# Patient Record
Sex: Female | Born: 1965 | Race: White | Hispanic: No | Marital: Single | Smoking: Never smoker
Health system: Southern US, Community
[De-identification: ages and names within clinical notes are randomized; demographics above are authoritative.]

## PROBLEM LIST (undated history)

## (undated) DIAGNOSIS — K589 Irritable bowel syndrome without diarrhea: Secondary | ICD-10-CM

## (undated) HISTORY — PX: BREAST ENHANCEMENT SURGERY: SHX7

## (undated) HISTORY — PX: ABDOMINAL HYSTERECTOMY: SHX81

## (undated) HISTORY — PX: TONSILLECTOMY: SUR1361

## (undated) HISTORY — DX: Irritable bowel syndrome, unspecified: K58.9

---

## 2003-09-15 ENCOUNTER — Emergency Department (HOSPITAL_COMMUNITY): Admission: EM | Admit: 2003-09-15 | Discharge: 2003-09-15 | Payer: Self-pay | Admitting: Emergency Medicine

## 2009-05-03 ENCOUNTER — Encounter
Admission: RE | Admit: 2009-05-03 | Discharge: 2009-08-01 | Payer: Self-pay | Admitting: Physical Medicine and Rehabilitation

## 2009-08-07 ENCOUNTER — Encounter
Admission: RE | Admit: 2009-08-07 | Discharge: 2009-10-14 | Payer: Self-pay | Admitting: Physical Medicine and Rehabilitation

## 2009-11-29 ENCOUNTER — Encounter: Admission: RE | Admit: 2009-11-29 | Discharge: 2009-11-29 | Payer: Self-pay | Admitting: Gastroenterology

## 2011-04-26 ENCOUNTER — Emergency Department (HOSPITAL_BASED_OUTPATIENT_CLINIC_OR_DEPARTMENT_OTHER)
Admission: EM | Admit: 2011-04-26 | Discharge: 2011-04-26 | Disposition: A | Discharge status: Home / Self Care | Payer: BC Managed Care – PPO | Attending: Emergency Medicine | Admitting: Emergency Medicine

## 2011-04-26 DIAGNOSIS — K3184 Gastroparesis: Secondary | ICD-10-CM | POA: Insufficient documentation

## 2011-04-26 DIAGNOSIS — R109 Unspecified abdominal pain: Secondary | ICD-10-CM | POA: Insufficient documentation

## 2014-07-05 ENCOUNTER — Ambulatory Visit (INDEPENDENT_AMBULATORY_CARE_PROVIDER_SITE_OTHER): Payer: BC Managed Care – PPO

## 2014-07-05 ENCOUNTER — Encounter: Payer: Self-pay | Admitting: Podiatry

## 2014-07-05 ENCOUNTER — Ambulatory Visit (INDEPENDENT_AMBULATORY_CARE_PROVIDER_SITE_OTHER): Payer: BC Managed Care – PPO | Admitting: Podiatry

## 2014-07-05 VITALS — BP 104/65 | HR 72 | Resp 16

## 2014-07-05 DIAGNOSIS — Z9889 Other specified postprocedural states: Secondary | ICD-10-CM

## 2014-07-05 DIAGNOSIS — M7750 Other enthesopathy of unspecified foot: Secondary | ICD-10-CM

## 2014-07-05 NOTE — Progress Notes (Signed)
Subjective:     Patient ID: Gabriela Young, female   DOB: November 18, 1965, 48 y.o.   MRN: [id number]  HPI patient presents that I was concerned because of some pain in the outside of my left foot for the last week or 2. Patient had surgery last year   Review of Systems     Objective:   Physical Exam Neurovascular status intact with well-healed surgical site fifth metatarsal left foot with good alignment noted and mild edema in the plantar surface    Assessment:     Probable low-grade inflammatory capsulitis left fifth MPJ    Plan:     Reviewed condition and at this time do not recommend aggressive treatment and less he gets worse. She will begin taking Aleve and will be seen back to recheck as needed

## 2017-01-01 ENCOUNTER — Ambulatory Visit (INDEPENDENT_AMBULATORY_CARE_PROVIDER_SITE_OTHER): Payer: BC Managed Care – PPO

## 2017-01-01 ENCOUNTER — Encounter: Payer: Self-pay | Admitting: Podiatry

## 2017-01-01 ENCOUNTER — Ambulatory Visit (INDEPENDENT_AMBULATORY_CARE_PROVIDER_SITE_OTHER): Payer: BC Managed Care – PPO | Admitting: Podiatry

## 2017-01-01 VITALS — BP 96/63 | HR 74 | Resp 16

## 2017-01-01 DIAGNOSIS — M2010 Hallux valgus (acquired), unspecified foot: Secondary | ICD-10-CM

## 2017-01-01 DIAGNOSIS — M21621 Bunionette of right foot: Secondary | ICD-10-CM | POA: Diagnosis not present

## 2017-01-01 NOTE — Patient Instructions (Signed)
Pre-Operative Instructions  Congratulations, you have decided to take an important step to improving your quality of life.  You can be assured that the doctors of [facility] will be with you every step of the way.  1. Plan to be at the surgery center/hospital at least 1 (one) hour prior to your scheduled time unless otherwise directed by the surgical center/hospital staff.  You must have a responsible adult accompany you, remain during the surgery and drive you home.  Make sure you have directions to the surgical center/hospital and know how to get there on time. 2. For hospital based surgery you will need to obtain a history and physical form from your family physician within 1 month prior to the date of surgery- we will give you a form for you primary physician.  3. We make every effort to accommodate the date you request for surgery.  There are however, times where surgery dates or times have to be moved.  We will contact you as soon as possible if a change in schedule is required.   4. No Aspirin/Ibuprofen for one week before surgery.  If you are on aspirin, any non-steroidal anti-inflammatory medications (Mobic, Aleve, Ibuprofen) you should stop taking it 7 days prior to your surgery.  You make take Tylenol  For pain prior to surgery.  5. Medications- If you are taking daily heart and blood pressure medications, seizure, reflux, allergy, asthma, anxiety, pain or diabetes medications, make sure the surgery center/hospital is aware before the day of surgery so they may notify you which medications to take or avoid the day of surgery. 6. No food or drink after midnight the night before surgery unless directed otherwise by surgical center/hospital staff. 7. No alcoholic beverages 24 hours prior to surgery.  No smoking 24 hours prior to or 24 hours after surgery. 8. Wear loose pants or shorts- loose enough to fit over bandages, boots, and casts. 9. No slip on shoes, sneakers are best. 10. Bring  your boot with you to the surgery center/hospital.  Also bring crutches or a walker if your physician has prescribed it for you.  If you do not have this equipment, it will be provided for you after surgery. 11. If you have not been contracted by the surgery center/hospital by the day before your surgery, call to confirm the date and time of your surgery. 12. Leave-time from work may vary depending on the type of surgery you have.  Appropriate arrangements should be made prior to surgery with your employer. 13. Prescriptions will be provided immediately following surgery by your doctor.  Have these filled as soon as possible after surgery and take the medication as directed. 14. Remove nail polish on the operative foot. 15. Wash the night before surgery.  The night before surgery wash the foot and leg well with the antibacterial soap provided and water paying special attention to beneath the toenails and in between the toes.  Rinse thoroughly with water and dry well with a towel.  Perform this wash unless told not to do so by your physician.  Enclosed: 1 Ice pack (please put in freezer the night before surgery)   1 Hibiclens skin cleaner   Pre-op Instructions  If you have any questions regarding the instructions, do not hesitate to call our office.  [place]: [address] [place], [place] [address] [phone]  [place]: [address] [place], [place] [address] [phone]  [place]: [address]  [place], [place] [address] [phone]   Dr.   Norman Regal DPM, Dr. Matthew Wagoner DPM, Dr. M. Todd Hyatt DPM, Dr. Titorya Stover DPM 

## 2017-01-03 NOTE — Progress Notes (Signed)
Subjective:     Patient ID: Gabriela Young, female   DOB: May 24, 1966, 51 y.o.   MRN: [id number]  HPI patient presents stating that she's had a lot of pain in her right bunion and the outside and she's known she's needed to get it fixed. She had her left one fixed about 4 years ago and did well   Review of Systems  All other systems reviewed and are negative.      Objective:   Physical Exam  Constitutional: She is oriented to person, place, and time.  Cardiovascular: Intact distal pulses.   Musculoskeletal: Normal range of motion.  Neurological: She is oriented to person, place, and time.  Skin: Skin is warm.  Nursing note and vitals reviewed.  neurovascular status intact muscle strength adequate range of motion within normal limits with patient found to have hyperostosis medial aspect first metatarsal head right with redness around the surface and pain in the outside of the fifth metatarsal right which is painful. 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 She wants to get this fixed and has made the decision to do this in the near future and at this time since she is oriented or other foot done I did allow her to read consent form reviewing alternative treatments complications. Patient wants surgery and after extensive review signs consent form for distal osteotomy right and distal fifth metatarsal right. I explained everything as listed and the fact recovery can take approximately 6 months and at this time I did dispense air fracture walker for postoperative usage with instructions  X-ray indicates that there is good healing of the osteotomy with good structural position of the first and fifth metatarsal left foot with  patient noted to have elevation of the intermetatarsal angle right and fifth metatarsal right with most of the deformity being on the medial dorsal side of the first metatarsal

## 2017-01-15 ENCOUNTER — Telehealth: Payer: Self-pay | Admitting: *Deleted

## 2017-01-15 NOTE — Telephone Encounter (Signed)
"  I'm calling to schedule my surgery."  I have you scheduled for April 17.  "I had not decided on that date because I needed to check with my boss but I'm glad you did go ahead and schedule me.  What time will I need to be there?"  Someone from the surgical center will call you the Friday or Monday prior to surgery date with the arrival time.

## 2017-02-10 ENCOUNTER — Telehealth: Payer: Self-pay | Admitting: *Deleted

## 2017-02-10 NOTE — Telephone Encounter (Signed)
"  I am scheduled for surgery on Tuesday of next week.  I had told Dr. Paulla Dolly that I had a shoe at home.  I can't find it.  So how do I go about getting a shoe?  I have a boot."  You will get a boot in our office when you come back for your follow-up and when Dr. Paulla Dolly wants you to transition into a shoe.  "Okay, thank you so much."

## 2017-02-16 ENCOUNTER — Encounter: Payer: Self-pay | Admitting: Podiatry

## 2017-02-16 DIAGNOSIS — M21541 Acquired clubfoot, right foot: Secondary | ICD-10-CM | POA: Diagnosis not present

## 2017-02-16 DIAGNOSIS — M2011 Hallux valgus (acquired), right foot: Secondary | ICD-10-CM | POA: Diagnosis not present

## 2017-02-24 ENCOUNTER — Ambulatory Visit (INDEPENDENT_AMBULATORY_CARE_PROVIDER_SITE_OTHER): Payer: BC Managed Care – PPO

## 2017-02-24 ENCOUNTER — Encounter: Payer: Self-pay | Admitting: Podiatry

## 2017-02-24 ENCOUNTER — Ambulatory Visit (INDEPENDENT_AMBULATORY_CARE_PROVIDER_SITE_OTHER): Payer: BC Managed Care – PPO | Admitting: Podiatry

## 2017-02-24 VITALS — BP 105/61 | HR 63 | Resp 16

## 2017-02-24 DIAGNOSIS — Z9889 Other specified postprocedural states: Secondary | ICD-10-CM

## 2017-02-24 DIAGNOSIS — M21621 Bunionette of right foot: Secondary | ICD-10-CM | POA: Diagnosis not present

## 2017-02-24 DIAGNOSIS — M2010 Hallux valgus (acquired), unspecified foot: Secondary | ICD-10-CM | POA: Diagnosis not present

## 2017-02-24 NOTE — Progress Notes (Signed)
Subjective:    Patient ID: Gabriela Young, female   DOB: 51 y.o.   MRN: [id number]   HPI patient states she's doing really well with her foot    ROS      Objective:  Physical Exam    Neurovascular status intact negative Homans sign noted with wound edges well coapted right both first and fifth metatarsal with good range of motion and no pain Assessment:     Doing very well post osteotomy first fifth metatarsal right with wound edges well coapted good range of motion and good alignment    Plan:    Reviewed x-rays reapplied dressing instructed on continued elevation compression immobilization and reappoint in 3 weeks or earlier if needed  X-rays indicate osteotomies are healing well with pin screw in place and no indications of pathology

## 2017-03-11 NOTE — Progress Notes (Signed)
DOS [id number] Autin Bunionectomy with pin fixation (cutting and move bone) Metatarsal osteotomy with screw fixation 5th rt

## 2017-03-17 ENCOUNTER — Ambulatory Visit (INDEPENDENT_AMBULATORY_CARE_PROVIDER_SITE_OTHER): Payer: BC Managed Care – PPO | Admitting: Podiatry

## 2017-03-17 ENCOUNTER — Ambulatory Visit (INDEPENDENT_AMBULATORY_CARE_PROVIDER_SITE_OTHER): Payer: BC Managed Care – PPO

## 2017-03-17 ENCOUNTER — Encounter: Payer: Self-pay | Admitting: Podiatry

## 2017-03-17 VITALS — BP 101/59 | HR 78 | Resp 16

## 2017-03-17 DIAGNOSIS — M21621 Bunionette of right foot: Secondary | ICD-10-CM

## 2017-03-17 DIAGNOSIS — M2010 Hallux valgus (acquired), unspecified foot: Secondary | ICD-10-CM

## 2017-03-17 NOTE — Progress Notes (Signed)
Subjective:    Patient ID: Gabriela Young, female   DOB: 51 y.o.   MRN: [id number]   HPI patient states that she's doing well with mild discomfort if she's on her foot to long but overall minimal pain and is working full time    ROS      Objective:  Physical Exam Neurovascular status intact with patient's right foot healing well with wound edges well coapted hallux in rectus position fifth MPJ and good alignment with    Assessment:    Doing well overall from foot surgery approximately 4 weeks     Plan:    X-ray taken and reviewed and recommended continued compression moderate immobilization and not going barefoot and no intense type activities  X-rays indicate that healing is occurring but there is some stress around the fifth metatarsal but it should heal uneventfully and will be watched. Reappoint in 6 weeks or earlier if necessary

## 2017-04-28 ENCOUNTER — Ambulatory Visit: Payer: BC Managed Care – PPO | Admitting: Podiatry

## 2017-04-29 ENCOUNTER — Encounter: Payer: BC Managed Care – PPO | Admitting: Podiatry

## 2017-05-07 ENCOUNTER — Ambulatory Visit (INDEPENDENT_AMBULATORY_CARE_PROVIDER_SITE_OTHER): Payer: BC Managed Care – PPO | Admitting: Podiatry

## 2017-05-07 ENCOUNTER — Ambulatory Visit (INDEPENDENT_AMBULATORY_CARE_PROVIDER_SITE_OTHER): Payer: BC Managed Care – PPO

## 2017-05-07 DIAGNOSIS — G5782 Other specified mononeuropathies of left lower limb: Secondary | ICD-10-CM

## 2017-05-07 DIAGNOSIS — G5762 Lesion of plantar nerve, left lower limb: Secondary | ICD-10-CM

## 2017-05-07 DIAGNOSIS — M21621 Bunionette of right foot: Secondary | ICD-10-CM

## 2017-05-07 NOTE — Progress Notes (Signed)
Subjective:    Patient ID: Gabriela Young, female   DOB: 52 y.o.   MRN: [id number]   HPI patient states she's doing well but is developed some shooting pain in her left foot with radiating discomfort into the digits    ROS      Objective:  Physical Exam neurovascular status intact negative Homans sign noted with right foot healing very well post osteotomy and fifth metatarsal osteotomy. Left third interspace show shooting radiating pains into the adjacent digits     Assessment:    Doing very well post osteotomy surgery right with probable neuroma symptomatology left     Plan:    H&P x-ray reviewed right and I did do a neuro lysis injection to reduce the in pulses of the nerve of the left third interspace and added a small amount steroid to reduce inflammation. Patient will be seen back to reevaluate again in 4 weeks  X-ray report indicated the osteotomies are healing very well with pin screw in place and good alignment noted. Also discussed long-term orthotics with patient

## 2017-06-09 ENCOUNTER — Ambulatory Visit (INDEPENDENT_AMBULATORY_CARE_PROVIDER_SITE_OTHER): Payer: BC Managed Care – PPO

## 2017-06-09 ENCOUNTER — Ambulatory Visit (INDEPENDENT_AMBULATORY_CARE_PROVIDER_SITE_OTHER): Payer: BC Managed Care – PPO | Admitting: Podiatry

## 2017-06-09 ENCOUNTER — Encounter: Payer: Self-pay | Admitting: Podiatry

## 2017-06-09 DIAGNOSIS — G5782 Other specified mononeuropathies of left lower limb: Secondary | ICD-10-CM

## 2017-06-09 DIAGNOSIS — M21621 Bunionette of right foot: Secondary | ICD-10-CM | POA: Diagnosis not present

## 2017-06-09 DIAGNOSIS — G5762 Lesion of plantar nerve, left lower limb: Secondary | ICD-10-CM

## 2017-06-09 NOTE — Progress Notes (Signed)
Subjective:    Patient ID: Gabriela Young, female   DOB: 51 y.o.   MRN: [id number]   HPI patient states that her right foot seems to be progressing well but her left foot continues to exhibit discomfort in the third interspace with improvement from previous treatment that was done approximately 1 month ago    ROS      Objective:  Physical Exam right foot shows well-healing surgical sites first and fifth metatarsal with wound edges well coapted good range of motion and mild edema in the left third interspace continues to show positive neuroma symptomatology with moderate shooting pain of the third interspace     Assessment:   Doing well from structural correction right with neuroma symptomatology left      Plan:     H&P condition reviewed and we'll focus on the left foot today. I went ahead did a sterile prep of the left foot I injected directly into the nerve with a combination of purified alcohol Marcaine a small amount steroid to reduce inflammation around the nerve root and patient be seen back 6 weeks to reevaluate

## 2017-07-19 ENCOUNTER — Ambulatory Visit: Payer: BC Managed Care – PPO | Admitting: Podiatry

## 2017-07-28 ENCOUNTER — Encounter: Payer: Self-pay | Admitting: Podiatry

## 2017-07-28 ENCOUNTER — Ambulatory Visit (INDEPENDENT_AMBULATORY_CARE_PROVIDER_SITE_OTHER): Payer: BC Managed Care – PPO | Admitting: Podiatry

## 2017-07-28 DIAGNOSIS — G5762 Lesion of plantar nerve, left lower limb: Secondary | ICD-10-CM

## 2017-07-28 DIAGNOSIS — G5782 Other specified mononeuropathies of left lower limb: Secondary | ICD-10-CM

## 2017-07-28 NOTE — Progress Notes (Signed)
Subjective:    Patient ID: Gabriela Young, female   DOB: 51 y.o.   MRN: [id number]   HPI patient states she seems to be improving but still feels and not and at times discomfort    ROS      Objective:  Physical Exam neurovascular status intact with patient's third interspace left continuing to exhibit radiating discomfort but it is localized and not as bad as it was in future     Assessment:    Improving neuroma symptomatology left     Plan:   Reinjected the nerve with a agent to both reduce inflammation and reduce the nerve irritation with a combination of purified alcohol Marcaine small amount of Kenalog. Patient tolerated procedure well and will be seen back 4 weeks

## 2017-08-25 ENCOUNTER — Encounter: Payer: Self-pay | Admitting: Podiatry

## 2017-08-25 ENCOUNTER — Ambulatory Visit (INDEPENDENT_AMBULATORY_CARE_PROVIDER_SITE_OTHER): Payer: BC Managed Care – PPO | Admitting: Podiatry

## 2017-08-25 DIAGNOSIS — G5762 Lesion of plantar nerve, left lower limb: Secondary | ICD-10-CM | POA: Diagnosis not present

## 2017-08-25 DIAGNOSIS — G5782 Other specified mononeuropathies of left lower limb: Secondary | ICD-10-CM

## 2017-08-25 NOTE — Progress Notes (Signed)
Subjective:    Patient ID: Gabriela Young, female   DOB: 51 y.o.   MRN: [id number]   HPI patient presents stating I'm starting to get shooting pain again and I did have good relief for a period of time with one shoe creating irritation    ROS      Objective:  Physical Exam neurovascular status intact with discomfort in the third interspace left with shooting radiating pains that had improved but is bothering her again     Assessment:    Neuroma symptomatology     Plan:    Reinjected the nerve to reduce all inflammation and to provide for neuro lysis of fact with a combination of purified alcohol small amount of steroidal medication and Marcaine with epinephrine which was tolerated well

## 2017-09-25 DIAGNOSIS — N952 Postmenopausal atrophic vaginitis: Secondary | ICD-10-CM | POA: Insufficient documentation

## 2017-12-22 ENCOUNTER — Encounter: Payer: Self-pay | Admitting: Podiatry

## 2017-12-22 ENCOUNTER — Ambulatory Visit: Payer: BC Managed Care – PPO

## 2017-12-22 ENCOUNTER — Ambulatory Visit: Payer: BC Managed Care – PPO | Admitting: Podiatry

## 2017-12-22 ENCOUNTER — Ambulatory Visit (INDEPENDENT_AMBULATORY_CARE_PROVIDER_SITE_OTHER): Payer: BC Managed Care – PPO

## 2017-12-22 ENCOUNTER — Other Ambulatory Visit: Payer: Self-pay | Admitting: Podiatry

## 2017-12-22 DIAGNOSIS — M79672 Pain in left foot: Secondary | ICD-10-CM | POA: Diagnosis not present

## 2017-12-22 DIAGNOSIS — G5762 Lesion of plantar nerve, left lower limb: Secondary | ICD-10-CM | POA: Diagnosis not present

## 2017-12-22 DIAGNOSIS — G5761 Lesion of plantar nerve, right lower limb: Secondary | ICD-10-CM | POA: Diagnosis not present

## 2017-12-22 DIAGNOSIS — M79671 Pain in right foot: Secondary | ICD-10-CM | POA: Diagnosis not present

## 2017-12-22 DIAGNOSIS — G5781 Other specified mononeuropathies of right lower limb: Secondary | ICD-10-CM

## 2017-12-22 DIAGNOSIS — G5782 Other specified mononeuropathies of left lower limb: Secondary | ICD-10-CM

## 2017-12-22 NOTE — Patient Instructions (Signed)
Pre-Operative Instructions  Congratulations, you have decided to take an important step towards improving your quality of life.  You can be assured that the doctors and staff at [facility] will be with you every step of the way.  Here are some important things you should know:  1. Plan to be at the surgery center/hospital at least 1 (one) hour prior to your scheduled time, unless otherwise directed by the surgical center/hospital staff.  You must have a responsible adult accompany you, remain during the surgery and drive you home.  Make sure you have directions to the surgical center/hospital to ensure you arrive on time. 2. If you are having surgery at [facility] or [facility] hospitals, you will need a copy of your medical history and physical form from your family physician within one month prior to the date of surgery. We will give you a form for your primary physician to complete.  3. We make every effort to accommodate the date you request for surgery.  However, there are times where surgery dates or times have to be moved.  We will contact you as soon as possible if a change in schedule is required.   4. No aspirin/ibuprofen for one week before surgery.  If you are on aspirin, any non-steroidal anti-inflammatory medications (Mobic, Aleve, Ibuprofen) should not be taken seven (7) days prior to your surgery.  You make take Tylenol for pain prior to surgery.  5. Medications - If you are taking daily heart and blood pressure medications, seizure, reflux, allergy, asthma, anxiety, pain or diabetes medications, make sure you notify the surgery center/hospital before the day of surgery so they can tell you which medications you should take or avoid the day of surgery. 6. No food or drink after midnight the night before surgery unless directed otherwise by surgical center/hospital staff. 7. No alcoholic beverages 24-hours prior to surgery.  No smoking 24-hours prior or 24-hours after  surgery. 8. Wear loose pants or shorts. They should be loose enough to fit over bandages, boots, and casts. 9. Don't wear slip-on shoes. Sneakers are preferred. 10. Bring your boot with you to the surgery center/hospital.  Also bring crutches or a walker if your physician has prescribed it for you.  If you do not have this equipment, it will be provided for you after surgery. 11. If you have not been contacted by the surgery center/hospital by the day before your surgery, call to confirm the date and time of your surgery. 12. Leave-time from work may vary depending on the type of surgery you have.  Appropriate arrangements should be made prior to surgery with your employer. 13. Prescriptions will be provided immediately following surgery by your doctor.  Fill these as soon as possible after surgery and take the medication as directed. Pain medications will not be refilled on weekends and must be approved by the doctor. 14. Remove nail polish on the operative foot and avoid getting pedicures prior to surgery. 15. Wash the night before surgery.  The night before surgery wash the foot and leg well with water and the antibacterial soap provided. Be sure to pay special attention to beneath the toenails and in between the toes.  Wash for at least three (3) minutes. Rinse thoroughly with water and dry well with a towel.  Perform this wash unless told not to do so by your physician.  Enclosed: 1 Ice pack (please put in freezer the night before surgery)   1 Hibiclens skin cleaner     Pre-op instructions  If you have any questions regarding the instructions, please do not hesitate to call our office.  [place]: [address], [place], [place] [address] -- [phone]  [place]: [address] [place], [place] [address] -- [phone]  [place]: [address]  [place], [place] [address] -- [phone]  [place]: [address], [place], [place] [address] -- [phone]  Website:  [email or web address] 

## 2017-12-22 NOTE — Progress Notes (Signed)
Subjective:   Patient ID: Gabriela Young, female   DOB: 52 y.o.   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  I allowed her to read consent form going over the procedure and risk and patient wants surgery signed consent form after extensive review.  Patient understands total recovery to take 6 months to 1 year and today I debrided the lesions on the right with no iatrogenic bleeding noted

## 2017-12-24 ENCOUNTER — Telehealth: Payer: Self-pay | Admitting: *Deleted

## 2017-12-24 NOTE — Telephone Encounter (Signed)
"  I saw Dr. Paulla Dolly this past week.  He is going to repair a neuroma for me.  I was wondering if Tuesday March 25 is available.  He even offered to do it on a Wednesday.  I'm going to try and do the Tuesday if that's possible.  Give me a call at your convenience."

## 2017-12-27 NOTE — Telephone Encounter (Signed)
I called and left her a message that I was going to schedule her for March 26.  I told her that if that date isn't good for her, to give me a call back and we can reschedule it.

## 2017-12-27 NOTE — Telephone Encounter (Signed)
"  I got your message.  I just want to make sure I heard you correctly.  You said you were scheduling me for March 26 correct?"  Yes, that is correct.  "What time will I need to be there.  I forget the protocol."  Someone from the surgical center will call you with the arrival time the Friday or Monday prior to the surgery date with the arrival time.

## 2018-01-25 ENCOUNTER — Encounter: Payer: Self-pay | Admitting: Podiatry

## 2018-01-25 DIAGNOSIS — G5762 Lesion of plantar nerve, left lower limb: Secondary | ICD-10-CM | POA: Diagnosis not present

## 2018-02-02 ENCOUNTER — Ambulatory Visit (INDEPENDENT_AMBULATORY_CARE_PROVIDER_SITE_OTHER): Payer: BC Managed Care – PPO | Admitting: Podiatry

## 2018-02-02 ENCOUNTER — Encounter: Payer: Self-pay | Admitting: Podiatry

## 2018-02-02 DIAGNOSIS — N951 Menopausal and female climacteric states: Secondary | ICD-10-CM | POA: Insufficient documentation

## 2018-02-02 DIAGNOSIS — D3610 Benign neoplasm of peripheral nerves and autonomic nervous system, unspecified: Secondary | ICD-10-CM

## 2018-02-02 DIAGNOSIS — M2010 Hallux valgus (acquired), unspecified foot: Secondary | ICD-10-CM

## 2018-02-02 DIAGNOSIS — K589 Irritable bowel syndrome without diarrhea: Secondary | ICD-10-CM | POA: Insufficient documentation

## 2018-02-02 DIAGNOSIS — M21621 Bunionette of right foot: Secondary | ICD-10-CM

## 2018-02-02 NOTE — Progress Notes (Signed)
Subjective:   Patient ID: Gabriela Young, female   DOB: 52 y.o.   MRN: [id number]   HPI Patient states doing very well with the left foot with minimal discomfort   ROS      Objective:  Physical Exam  Neurovascular status intact with patient's left third interspace healing well wound edges well coapted     Assessment:  Doing well from neurectomy third interspace left     Plan:  Instructed on soaks and gradual return to soft shoe gear with continued elevation as needed.  Patient be seen back on an as-needed basis and has routine healing at this time

## 2018-02-04 ENCOUNTER — Encounter: Payer: Self-pay | Admitting: Podiatry

## 2019-05-11 ENCOUNTER — Other Ambulatory Visit: Payer: Self-pay

## 2019-05-11 ENCOUNTER — Encounter: Payer: Self-pay | Admitting: Podiatry

## 2019-05-11 ENCOUNTER — Ambulatory Visit: Payer: BC Managed Care – PPO | Admitting: Podiatry

## 2019-05-11 ENCOUNTER — Other Ambulatory Visit: Payer: Self-pay | Admitting: Podiatry

## 2019-05-11 ENCOUNTER — Ambulatory Visit (INDEPENDENT_AMBULATORY_CARE_PROVIDER_SITE_OTHER): Payer: BC Managed Care – PPO

## 2019-05-11 VITALS — Temp 98.2°F

## 2019-05-11 DIAGNOSIS — M7752 Other enthesopathy of left foot: Secondary | ICD-10-CM | POA: Diagnosis not present

## 2019-05-12 NOTE — Progress Notes (Signed)
Subjective:   Patient ID: Gabriela Young, female   DOB: 53 y.o.   MRN: [id number]   HPI Patient presents stating my fourth toe has really started to hurt on my left foot and I am not sure what I did.  States is been hurting for the last 2 to 3 months and at times makes shoe gear difficult   ROS      Objective:  Physical Exam  Neurovascular status intact with well-healed surgical sites bilateral with patient noted to have a mild edematous of the fourth digit left at the distal interphalangeal joint with discomfort with palpation.  Patient is found to have good digital perfusion well oriented     Assessment:  Probability for inflammatory capsulitis or arthritis of the inner phalangeal joint digit 4 left     Plan:  H NP x-ray reviewed and today I did a proximal block 60 mg like Marcaine mixture after proper anesthesia I did do a small injection of the joint with 2 mg Dexasone Kenalog 3 mg Xylocaine and advised on wider shoes.  If symptoms persist may require distal arthroplasty  X-rays indicated there may be slight compression of the joint with good healing of the osteotomy first and fifth metatarsal left with fixation in place signed visit

## 2021-02-10 ENCOUNTER — Emergency Department (HOSPITAL_BASED_OUTPATIENT_CLINIC_OR_DEPARTMENT_OTHER): Payer: BC Managed Care – PPO

## 2021-02-10 ENCOUNTER — Emergency Department (HOSPITAL_BASED_OUTPATIENT_CLINIC_OR_DEPARTMENT_OTHER)
Admission: EM | Admit: 2021-02-10 | Discharge: 2021-02-10 | Disposition: A | Discharge status: Home / Self Care | Payer: BC Managed Care – PPO | Attending: Emergency Medicine | Admitting: Emergency Medicine

## 2021-02-10 ENCOUNTER — Encounter (HOSPITAL_BASED_OUTPATIENT_CLINIC_OR_DEPARTMENT_OTHER): Payer: Self-pay

## 2021-02-10 ENCOUNTER — Other Ambulatory Visit: Payer: Self-pay

## 2021-02-10 DIAGNOSIS — T148XXA Other injury of unspecified body region, initial encounter: Secondary | ICD-10-CM

## 2021-02-10 DIAGNOSIS — S2239XA Fracture of one rib, unspecified side, initial encounter for closed fracture: Secondary | ICD-10-CM | POA: Insufficient documentation

## 2021-02-10 DIAGNOSIS — S32019A Unspecified fracture of first lumbar vertebra, initial encounter for closed fracture: Secondary | ICD-10-CM | POA: Diagnosis not present

## 2021-02-10 DIAGNOSIS — S0990XA Unspecified injury of head, initial encounter: Secondary | ICD-10-CM | POA: Diagnosis not present

## 2021-02-10 DIAGNOSIS — S3992XA Unspecified injury of lower back, initial encounter: Secondary | ICD-10-CM | POA: Diagnosis present

## 2021-02-10 DIAGNOSIS — W108XXA Fall (on) (from) other stairs and steps, initial encounter: Secondary | ICD-10-CM | POA: Insufficient documentation

## 2021-02-10 DIAGNOSIS — M25521 Pain in right elbow: Secondary | ICD-10-CM | POA: Diagnosis not present

## 2021-02-10 DIAGNOSIS — W19XXXA Unspecified fall, initial encounter: Secondary | ICD-10-CM

## 2021-02-10 MED ORDER — ACETAMINOPHEN 500 MG PO TABS
1000.0000 mg | ORAL_TABLET | Freq: Once | ORAL | Status: AC
  Administered 2021-02-10: 1000 mg via ORAL
  Filled 2021-02-10: qty 2

## 2021-02-10 MED ORDER — KETOROLAC TROMETHAMINE 60 MG/2ML IM SOLN
30.0000 mg | Freq: Once | INTRAMUSCULAR | Status: AC
  Administered 2021-02-10: 30 mg via INTRAMUSCULAR
  Filled 2021-02-10: qty 2

## 2021-02-10 MED ORDER — CYCLOBENZAPRINE HCL 10 MG PO TABS: 10.0000 mg | ORAL_TABLET | Freq: Two times a day (BID) | ORAL | 0 refills | Status: AC | PRN

## 2021-02-10 NOTE — Discharge Instructions (Addendum)
Seen after a fall, imaging reveals that you have a acute nondisplaced avulsion fracture of the right L1 transverse process.  I have given you a prescription for a muscle relaxer please take as prescribed.  Please beware this medication make you drowsy do not consume alcohol or operate heavy machinery while taking this medication.  Recommend taking over-the-counter pain medications like ibuprofen and/or Tylenol every 6 hours as needed.  Please follow dosage and on the back of bottle.    I like you to follow-up with your PCP in 2 to 4 weeks for recheck of your lumbar spine.  Come back to the emergency department if you develop chest pain, shortness of breath, severe abdominal pain, uncontrolled nausea, vomiting, diarrhea.

## 2021-02-10 NOTE — ED Provider Notes (Signed)
[facility]ER[facility]ENC[facility] DEPARTMENT Provider Note   CSN: [id number] Arrival date & time: 02/10/21  1057     History Chief Complaint  Patient presents with  . Fall    Gabriela Young is a 55 y.o. female.  HPI    Patient with no significant medical history presents to the emergency department after having a mechanical fall.  She endorses today she was at the top of her stairs and slipped on the wood causing her to fall onto her right side.  She endorses that she slid down approximately 8 steps, she states she hit her head, but denies losing conscious, is not on anticoagulant.  She denies headaches, change in vision, worsening paresthesias or weakness in the upper or lower extremities, denies lightheadedness, dizziness, felt slightly nauseated.  She endorsed that she has severe right elbow pain, right rib pain, and lower back pain.  She states movement tends to make the pain worse, she denies  alleviating factors.  She has not had any medications for this.  Patient denies headaches, fevers, chills, shortness of breath, chest pain, abdominal pain, vomiting, diarrhea, worsening pedal edema.  Past Medical History:  Diagnosis Date  . Irritable bowel syndrome (IBS)     Patient Active Problem List   Diagnosis Date Noted  . IBS (irritable bowel syndrome) 02/02/2018  . Menopausal syndrome 02/02/2018  . Atrophic vaginitis 09/25/2017    Past Surgical History:  Procedure Laterality Date  . ABDOMINAL HYSTERECTOMY    . BREAST ENHANCEMENT SURGERY    . TONSILLECTOMY       OB History   No obstetric history on file.     History reviewed. No pertinent family history.  Social History   Tobacco Use  . Smoking status: Never Smoker  . Smokeless tobacco: Never Used  Substance Use Topics  . Alcohol use: Yes  . Drug use: Never    Home Medications Prior to Admission medications   Medication Sig Start Date End Date Taking? Authorizing Provider  cyclobenzaprine (FLEXERIL) 10 MG  tablet Take 1 tablet (10 mg total) by mouth 2 (two) times daily as needed for muscle spasms. 02/10/21  Yes Marcello Fennel, PA-C  CycloSPORINE (RESTASIS OP) Apply to eye.    [provider]  estradiol (ESTRACE) 0.1 MG/GM vaginal cream estradiol 0.01% (0.1 mg/gram) vaginal cream  PLACE 1 GRAM VAGINALLY 2 TIMES A [facility]K HS    [provider]  estradiol (VIVELLE-DOT) 0.1 MG/24HR patch APP 1 PA EXT TO THE SKIN 2 TIMES A [facility]K 11/15/17   [provider]  Rolan Lipa 290 MCG CAPS capsule  05/10/19   [provider]    Allergies    Sulfa antibiotics  Review of Systems   Review of Systems  Constitutional: Negative for chills and fever.  HENT: Negative for congestion and sore throat.   Eyes: Negative for visual disturbance.  Respiratory: Negative for shortness of breath.   Cardiovascular: Negative for chest pain.  Gastrointestinal: Positive for nausea. Negative for abdominal pain, constipation and vomiting.  Genitourinary: Negative for enuresis and flank pain.  Musculoskeletal: Positive for back pain.       Right rib pain, right elbow pain  Skin: Negative for rash.  Neurological: Negative for dizziness and headaches.  Hematological: Does not bruise/bleed easily.    Physical Exam Updated Vital Signs BP 110/67 (BP Location: Right Arm)   Pulse 63   Temp 98.2 F (36.8 C) (Oral)   Resp 19   Ht 5' 7.5" (1.715 m)   Wt  59 kg   SpO2 100%   BMI 20.06 kg/m   Physical Exam Vitals and nursing note reviewed.  Constitutional:      General: She is not in acute distress.    Appearance: She is not ill-appearing.  HENT:     Head: Normocephalic and atraumatic.     Comments: Patient's head was evaluated she has a erythematous mark on her right mastoid, there is no other gross deformities present, no raccoon eyes or battle signs present.    Nose: No congestion.  Eyes:     Extraocular Movements: Extraocular movements intact.     Conjunctiva/sclera: Conjunctivae normal.      Pupils: Pupils are equal, round, and reactive to light.  Cardiovascular:     Rate and Rhythm: Normal rate and regular rhythm.     Pulses: Normal pulses.     Heart sounds: No murmur heard. No friction rub. No gallop.   Pulmonary:     Effort: No respiratory distress.     Breath sounds: No wheezing, rhonchi or rales.  Abdominal:     Palpations: Abdomen is soft.     Tenderness: There is no abdominal tenderness.  Musculoskeletal:     Cervical back: No rigidity.     Comments: Patient's chest was palpated she had noted tenderness to palpation along her ninth and 10th rib on the right side posteriorly, there is no flail chest sign or crepitus present.  Patient spine was palpated it was tender to palpation in her cervical spine, as well as her lumbar spine, there is no step-off or deformities present.  She is moving all 4 extremities.  Skin:    General: Skin is warm and dry.     Findings: Lesion present. No bruising.     Comments: Patient has abrasions on her lumbar spine as well as her right epicondyle.  Neurological:     Mental Status: She is alert.     GCS: GCS eye subscore is 4. GCS verbal subscore is 5. GCS motor subscore is 6.     Cranial Nerves: No facial asymmetry.     Motor: No weakness.     Comments: Cranial nerves II through XII grossly intact  Patient had no difficulty with word finding.  Psychiatric:        Mood and Affect: Mood normal.     ED Results / Procedures / Treatments   Labs (all labs ordered are listed, but only abnormal results are displayed) Labs Reviewed - No data to display  EKG None  Radiology DG Ribs Unilateral W/Chest Right  Result Date: 02/10/2021 CLINICAL DATA:  Pain following fall EXAM: RIGHT RIBS AND CHEST - 3+ VIEW COMPARISON:  None. FINDINGS: Frontal chest as well as oblique and cone-down rib images were obtained. Lungs are clear. Heart size and pulmonary vascularity are normal. No adenopathy. No evident pneumothorax or pleural effusion. No  appreciable rib fracture. IMPRESSION: No appreciable rib fracture.  Lungs clear. Electronically Signed   By: Lowella Grip III M.D.   On: 02/10/2021 12:57   DG Elbow 2 Views Right  Result Date: 02/10/2021 CLINICAL DATA:  Pain following fall EXAM: RIGHT ELBOW - 2 VIEW COMPARISON:  None. FINDINGS: Frontal and lateral views were obtained. No appreciable fracture or dislocation. No joint effusion. Joint spaces appear normal. No erosive change. IMPRESSION: No fracture or dislocation.  No evident arthropathy. Electronically Signed   By: Lowella Grip III M.D.   On: 02/10/2021 12:58   CT Head Wo Contrast  Result Date: 02/10/2021  CLINICAL DATA:  Pain following fall EXAM: CT HEAD WITHOUT CONTRAST CT CERVICAL SPINE WITHOUT CONTRAST TECHNIQUE: Multidetector CT imaging of the head and cervical spine was performed following the standard protocol without intravenous contrast. Multiplanar CT image reconstructions of the cervical spine were also generated. COMPARISON:  None. FINDINGS: CT HEAD FINDINGS Brain: Ventricles and sulci are normal in size and configuration. There is no intracranial mass, hemorrhage, extra-axial fluid collection, or midline shift. The brain parenchyma appears unremarkable. No appreciable acute infarct. Vascular: No hyperdense vessel.  No evident vascular calcification. Skull: Bony calvarium appears intact. Sinuses/Orbits: Visualized paranasal sinuses are clear. There is leftward deviation of the nasal septum. Orbits appear symmetric bilaterally. Other: Visualized mastoid air cells clear. CT CERVICAL SPINE FINDINGS Alignment: There is no appreciable spondylolisthesis. Skull base and vertebrae: Skull base and craniocervical junction regions appear normal. No evident fracture. No blastic or lytic bone lesions. There is incomplete fusion of the arch in the midline at C2 posteriorly, an apparent anatomic variant. Soft tissues and spinal canal: The prevertebral soft tissues and predental space  regions are normal. There is no appreciable cord or canal hematoma. No paraspinous lesions are evident. Disc levels: Disc spaces appear unremarkable. There is mild facet hypertrophy at several levels without nerve root edema or effacement. No evident disc extrusion or stenosis. Upper chest: There is scarring in each upper lung region. No apical pneumothorax on either side. Other: None IMPRESSION: CT head: Deviated nasal septum.  Study otherwise unremarkable. CT cervical spine: No fracture or spondylolisthesis. Mild osteoarthritic change at several levels. No nerve root edema or effacement. No disc extrusion or stenosis. There is apical scarring bilaterally. Electronically Signed   By: Lowella Grip III M.D.   On: 02/10/2021 12:36   CT Cervical Spine Wo Contrast  Result Date: 02/10/2021 CLINICAL DATA:  Pain following fall EXAM: CT HEAD WITHOUT CONTRAST CT CERVICAL SPINE WITHOUT CONTRAST TECHNIQUE: Multidetector CT imaging of the head and cervical spine was performed following the standard protocol without intravenous contrast. Multiplanar CT image reconstructions of the cervical spine were also generated. COMPARISON:  None. FINDINGS: CT HEAD FINDINGS Brain: Ventricles and sulci are normal in size and configuration. There is no intracranial mass, hemorrhage, extra-axial fluid collection, or midline shift. The brain parenchyma appears unremarkable. No appreciable acute infarct. Vascular: No hyperdense vessel.  No evident vascular calcification. Skull: Bony calvarium appears intact. Sinuses/Orbits: Visualized paranasal sinuses are clear. There is leftward deviation of the nasal septum. Orbits appear symmetric bilaterally. Other: Visualized mastoid air cells clear. CT CERVICAL SPINE FINDINGS Alignment: There is no appreciable spondylolisthesis. Skull base and vertebrae: Skull base and craniocervical junction regions appear normal. No evident fracture. No blastic or lytic bone lesions. There is incomplete fusion  of the arch in the midline at C2 posteriorly, an apparent anatomic variant. Soft tissues and spinal canal: The prevertebral soft tissues and predental space regions are normal. There is no appreciable cord or canal hematoma. No paraspinous lesions are evident. Disc levels: Disc spaces appear unremarkable. There is mild facet hypertrophy at several levels without nerve root edema or effacement. No evident disc extrusion or stenosis. Upper chest: There is scarring in each upper lung region. No apical pneumothorax on either side. Other: None IMPRESSION: CT head: Deviated nasal septum.  Study otherwise unremarkable. CT cervical spine: No fracture or spondylolisthesis. Mild osteoarthritic change at several levels. No nerve root edema or effacement. No disc extrusion or stenosis. There is apical scarring bilaterally. Electronically Signed   By: Lowella Grip III M.D.  On: 02/10/2021 12:36   CT Lumbar Spine Wo Contrast  Result Date: 02/10/2021 CLINICAL DATA:  Low back pain after falling today. EXAM: CT LUMBAR SPINE WITHOUT CONTRAST TECHNIQUE: Multidetector CT imaging of the lumbar spine was performed without intravenous contrast administration. Multiplanar CT image reconstructions were also generated. COMPARISON:  Abdominal CT 11/29/2009 FINDINGS: Segmentation: There are 5 lumbar type vertebral bodies. Alignment: Normal. Vertebrae: Nondisplaced acute avulsion fracture of the right transverse process (image 28/3). No other fractures are identified. The vertebral body heights are maintained. Paraspinal and other soft tissues: No paraspinal hematoma. Mild centrilobular emphysema. Disc levels: The disc heights are preserved. There is mild disc bulging in the lower lumbar spine without large disc herniation or significant spinal stenosis. IMPRESSION: 1. Acute nondisplaced avulsion fracture of the right L1 transverse process. 2. No other acute lumbar spine findings. 3. Mild disc bulging without large disc herniation or  significant spinal stenosis. Electronically Signed   By: Richardean Sale M.D.   On: 02/10/2021 12:30    Procedures Procedures   Medications Ordered in ED Medications  acetaminophen (TYLENOL) tablet 1,000 mg (1,000 mg Oral Given 02/10/21 1140)  ketorolac (TORADOL) injection 30 mg (30 mg Intramuscular Given 02/10/21 1343)    ED Course  I have reviewed the triage vital signs and the nursing notes.  Pertinent labs & imaging results that were available during my care of the patient were reviewed by me and considered in my medical decision making (see chart for details).    MDM Rules/Calculators/A&P                          Initial impression-patient presents after a fall.  She is alert, does not appear in acute distress, vital signs reassuring.  Concern for multiple orthopedic injuries, will obtain CT head, neck, lumbar spine, will also obtain x-ray of right sided ribs as well as right elbow.  Will provide patient with Tylenol and reassess.  Work-up-CT head and neck are both negative for acute findings.  A CT lumbar spine shows small nondisplaced avulsion fracture of the right L1 transverse process.  X-ray of ribs, right elbow negative for acute findings.  Rule out- low suspicion for intracranial head bleed or cranial fracture as there  is no neuro deficits on my exam, CT head is negative for acute findings.  I have low suspicion for spinal cord abnormality as patient denies urinary incontinency, retention, difficulty with bowel movements, denies saddle paresthesias.  Spine was palpated there is no step-off, crepitus or gross deformities felt, patient had full range of motion, neurovascular fully intact in the lower extremities.  Low suspicion for rib fracture or pneumothorax as lung sounds are clear bilaterally, imaging is negative for acute findings.  Low suspicion for intra-abdominal trauma as abdomen soft nontender to palpation.  No peritoneal sign noted.   Plan-patient has a acute  nondisplaced avulsion fracture of the right L1, suspect this is causing her right-sided pain.  Will provide her with muscle relaxers, recommend over-the-counter pain medications follow-up with PCP next 2 to 4 weeks for reevaluation.  Vital signs have remained stable, no indication for hospital admission.  Patient discussed with attending and they agreed with assessment and plan.  Patient given at home care as well strict return precautions.  Patient verbalized that they understood agreed to said plan.   Final Clinical Impression(s) / ED Diagnoses Final diagnoses:  Fall, initial encounter  Fracture    Rx / DC Orders ED Discharge Orders  Ordered    cyclobenzaprine (FLEXERIL) 10 MG tablet  2 times daily PRN        02/10/21 1325           Aron Baba 02/10/21 1359    Long, Wonda Old[facility], MD 02/11/21 [phone]

## 2021-02-10 NOTE — ED Notes (Signed)
AVS copy provided to client, informed pt that Rx x 1 was sent to her pharmacy listed on the EMR, safety measures were discussed in regards to muscle relaxant Rx. Also discussed other ways to aid in pain management, Opportunity for questions provided. Pt escorted to POV. Ambulated to exit without assistance, gait steady.

## 2021-02-10 NOTE — ED Notes (Signed)
Pt provided with diet coke with permission from PA

## 2021-02-10 NOTE — ED Triage Notes (Signed)
Pt states slipped and fell down appx 8 stairs. Hit right lower back and head. Denies LOC/neck pain. C/o right flank pain and rib pain as well as right elbow pain.

## 2022-07-15 ENCOUNTER — Ambulatory Visit: Payer: BC Managed Care – PPO | Admitting: *Deleted

## 2022-07-15 DIAGNOSIS — M7751 Other enthesopathy of right foot: Secondary | ICD-10-CM

## 2022-07-15 DIAGNOSIS — M778 Other enthesopathies, not elsewhere classified: Secondary | ICD-10-CM

## 2022-07-15 DIAGNOSIS — M7752 Other enthesopathy of left foot: Secondary | ICD-10-CM

## 2022-07-15 NOTE — Progress Notes (Signed)
Patient presents today to be casted for custom molded orthotics. Dr. Paulla Dolly has been treating patient for capsulitis.   Impression foam cast was taken. ABN signed.  Patient info-  Shoe size: 9-10  Shoe style: Casual  Height: 5'8  Weight: 130  Insurance: [employer]   Patient will be notified once orthotics arrive in office and reappoint for fitting at that time.

## 2022-07-16 IMAGING — CT CT HEAD W/O CM
3 series · 15 of 47 positions shown, 18 images · non-contrast
Comparison: None.

CLINICAL DATA: Pain following fall

EXAM:
CT HEAD WITHOUT CONTRAST
CT CERVICAL SPINE WITHOUT CONTRAST
TECHNIQUE: Multidetector CT imaging of the head and cervical spine was
performed following the standard protocol without intravenous
contrast. Multiplanar CT image reconstructions of the cervical spine
were also generated.

[Series 2: head 5.0 h30s · axial · 0.43mm/px · z∈[-147,-17]mm · 9 of 32 slices shown, 12 images]
[im 3/32  brain]
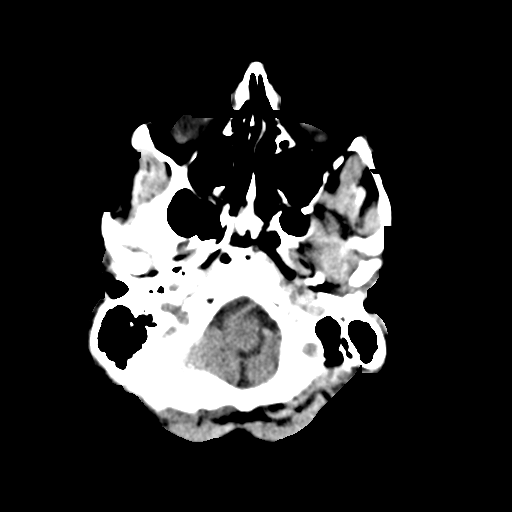
[im 3/32  bone]
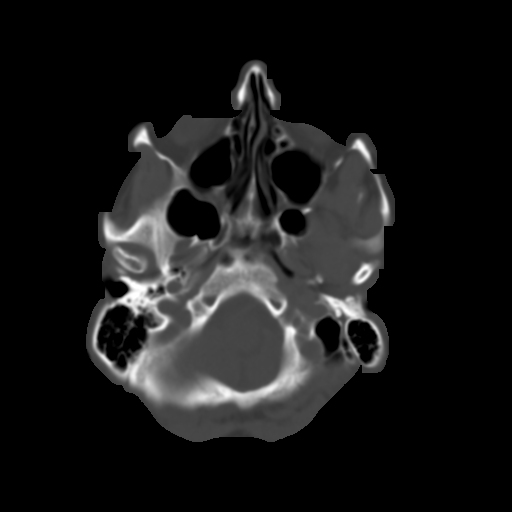
[im 6/32  brain]
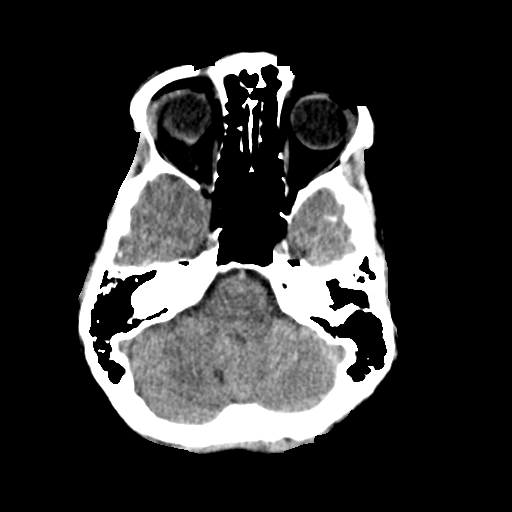
[im 9/32  brain]
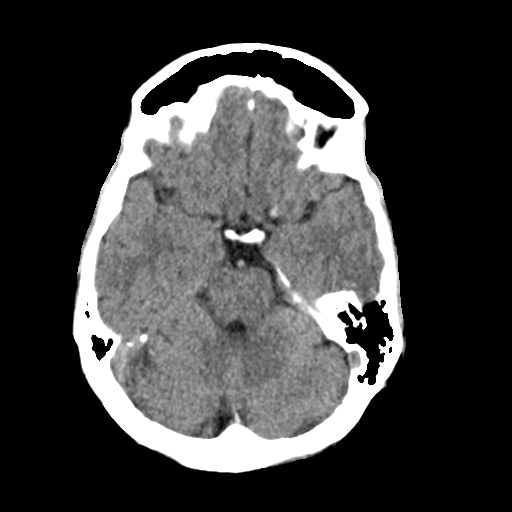
[im 12/32  brain]
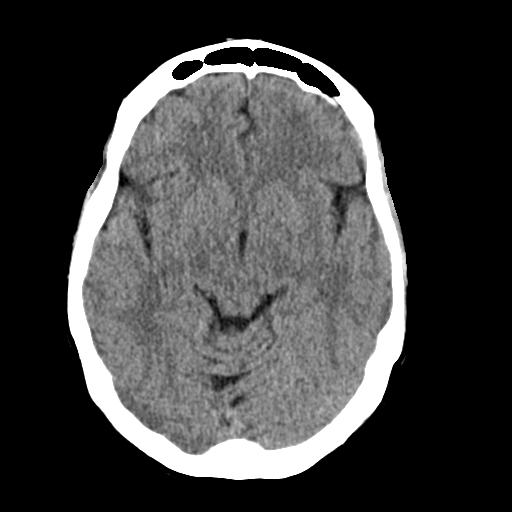
[im 17/32  brain]
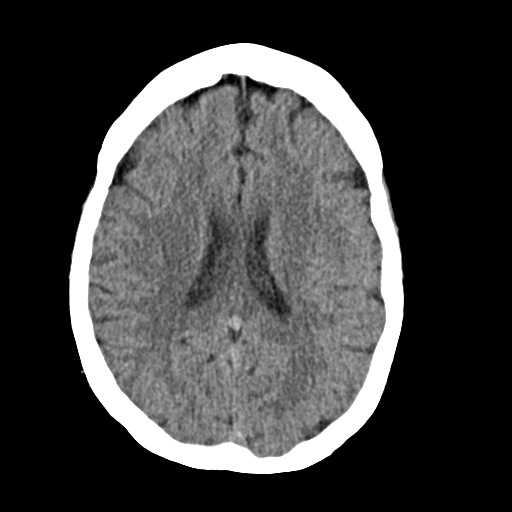
[im 17/32  bone]
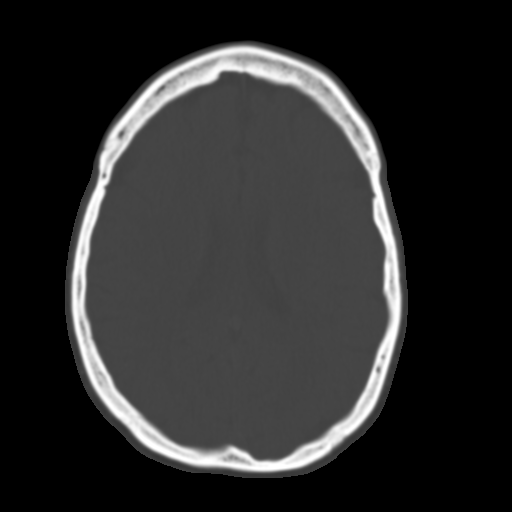
[im 20/32  brain]
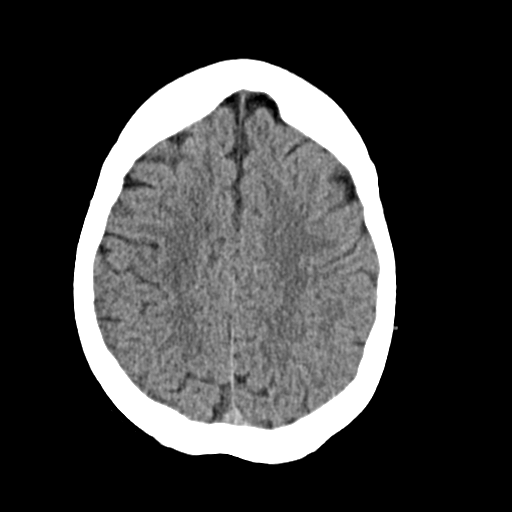
[im 23/32  brain]
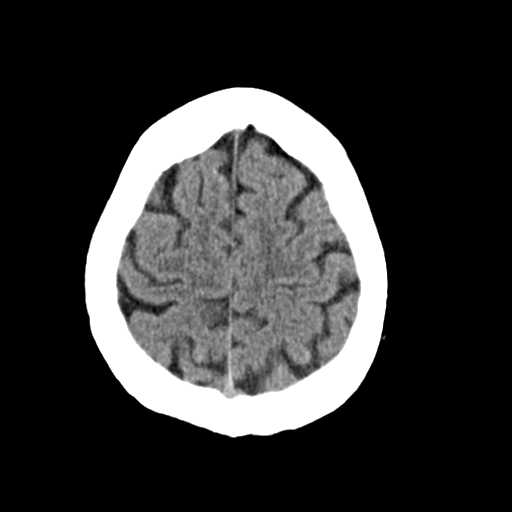
[im 26/32  brain]
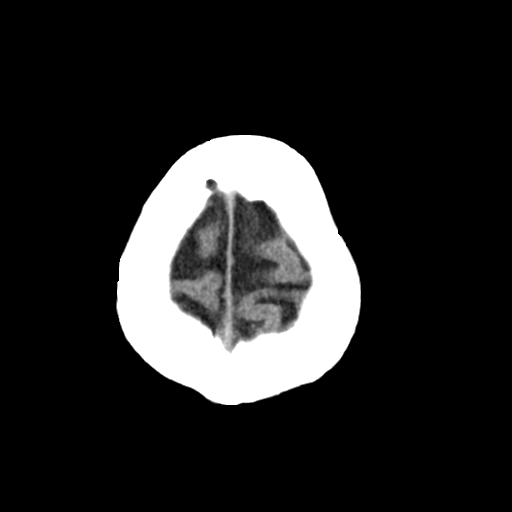
[im 29/32  brain]
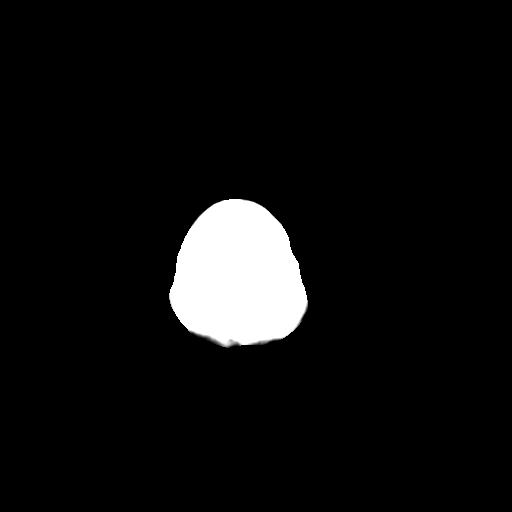
[im 29/32  bone]
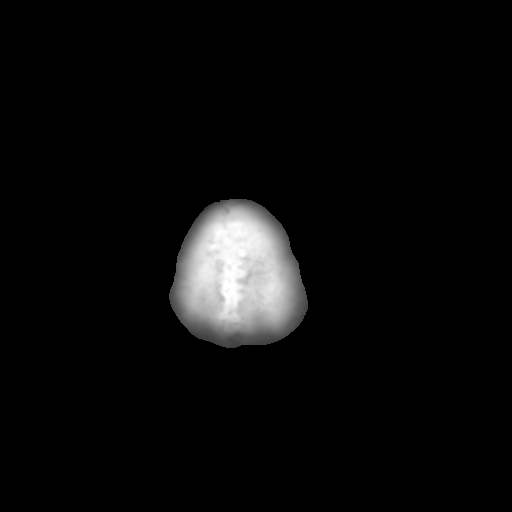

[Series 4: head 3.0 mpr cor · coronal · 0.32mm/px · 3 of 67 slices shown]
[im 23/67  brain]
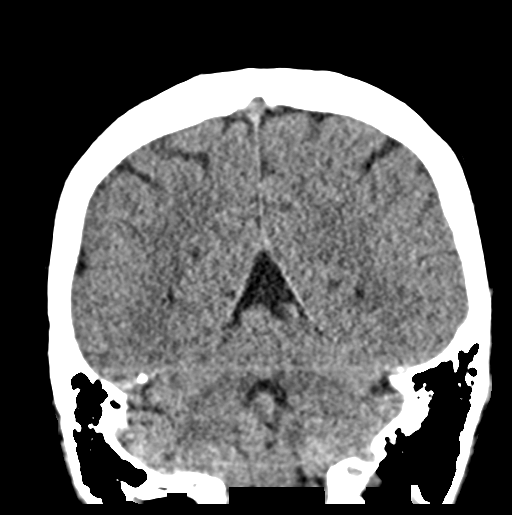
[im 30/67  brain]
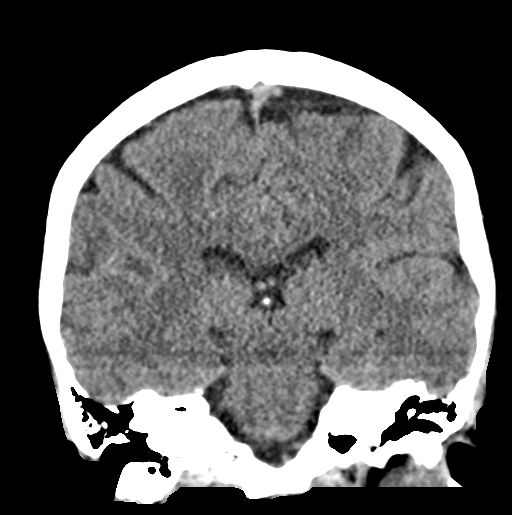
[im 37/67  brain]
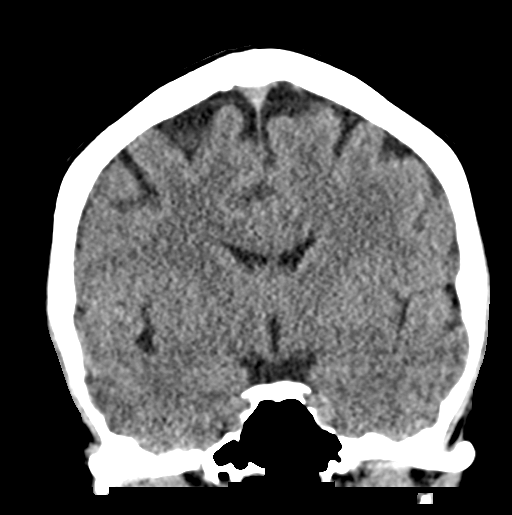

[Series 5: head 3.0 mpr sag · sagittal · 0.32mm/px · 3 of 53 slices shown]
[im 18/53  brain]
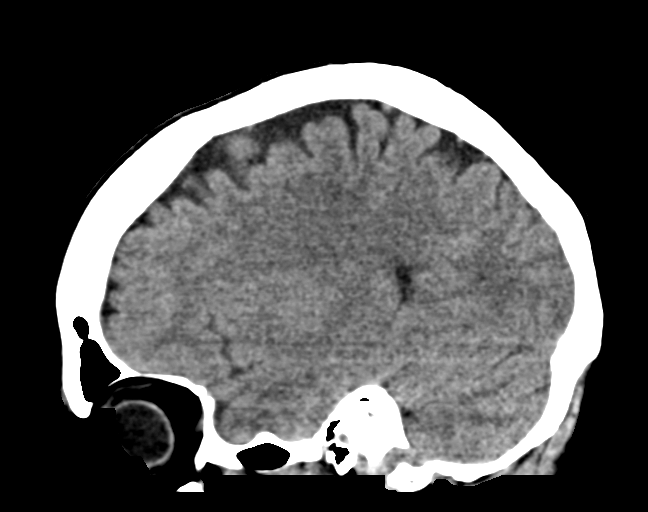
[im 27/53  brain]
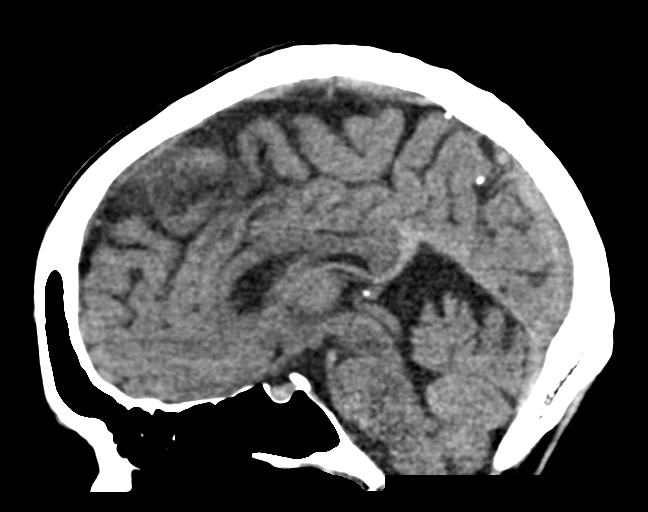
[im 35/53  brain]
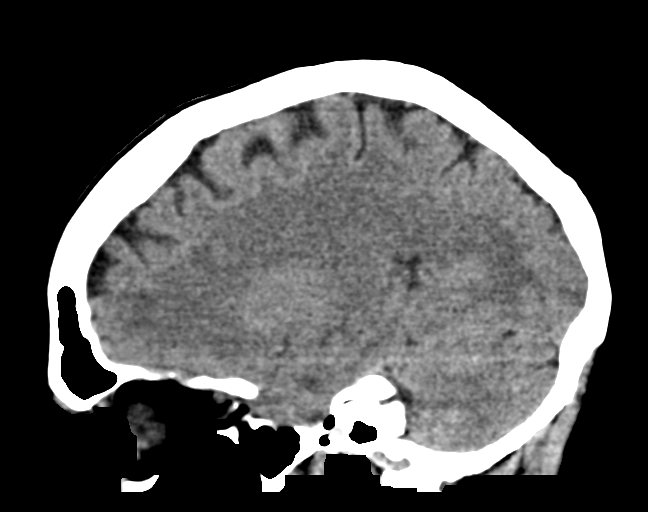

[15 of 47 positions shown; findings below may reference images not displayed]

FINDINGS: CT HEAD FINDINGS

Brain: Ventricles and sulci are normal in size and configuration.
There is no intracranial mass, hemorrhage, extra-axial fluid
collection, or midline shift. The brain parenchyma appears
unremarkable. No appreciable acute infarct.

Vascular: No hyperdense vessel.  No evident vascular calcification.

Skull: Bony calvarium appears intact.

Sinuses/Orbits: Visualized paranasal sinuses are clear. There is
leftward deviation of the nasal septum. Orbits appear symmetric
bilaterally.

Other: Visualized mastoid air cells clear.

CT CERVICAL SPINE FINDINGS

Alignment: There is no appreciable spondylolisthesis.

Skull base and vertebrae: Skull base and craniocervical junction
regions appear normal. No evident fracture. No blastic or lytic bone
lesions. There is incomplete fusion of the arch in the midline at C2
posteriorly, an apparent anatomic variant.

Soft tissues and spinal canal: The prevertebral soft tissues and
predental space regions are normal. There is no appreciable cord or
canal hematoma. No paraspinous lesions are evident.

Disc levels: Disc spaces appear unremarkable. There is mild facet
hypertrophy at several levels without nerve root edema or
effacement. No evident disc extrusion or stenosis.

Upper chest: There is scarring in each upper lung region. No apical
pneumothorax on either side.

Other: None
IMPRESSION: CT head: Deviated nasal septum.  Study otherwise unremarkable.

CT cervical spine: No fracture or spondylolisthesis. Mild
osteoarthritic change at several levels. No nerve root edema or
effacement. No disc extrusion or stenosis. There is apical scarring
bilaterally.

## 2022-07-16 IMAGING — CT CT L SPINE W/O CM
3 of 4 series · 11 of 33 positions shown, 13 images · non-contrast
Comparison: Abdominal CT 11/29/2009

CLINICAL DATA: Low back pain after falling today.

EXAM:
CT LUMBAR SPINE WITHOUT CONTRAST
TECHNIQUE: Multidetector CT imaging of the lumbar spine was performed without
intravenous contrast administration. Multiplanar CT image
reconstructions were also generated.

[Series 7: coronal bone · coronal · 0.29mm/px · 3 of 61 slices shown]
[im 13/61  bone]
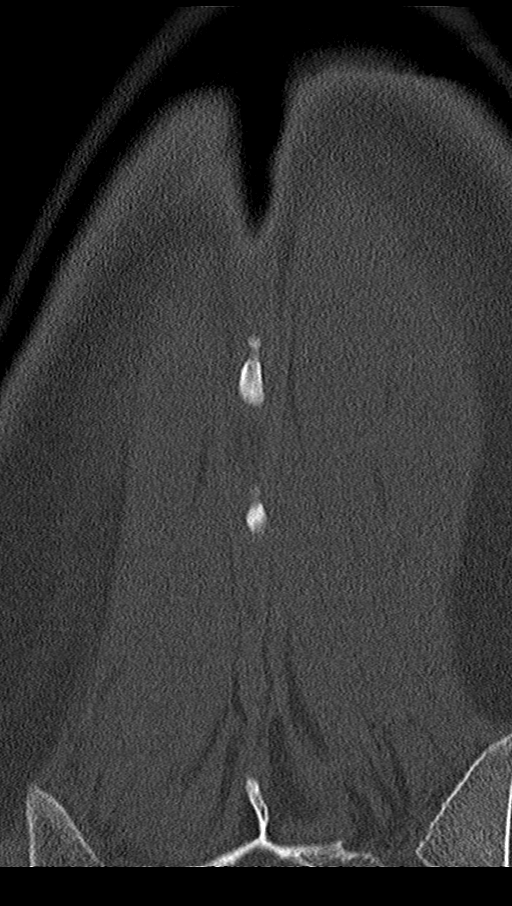
[im 25/61  bone]
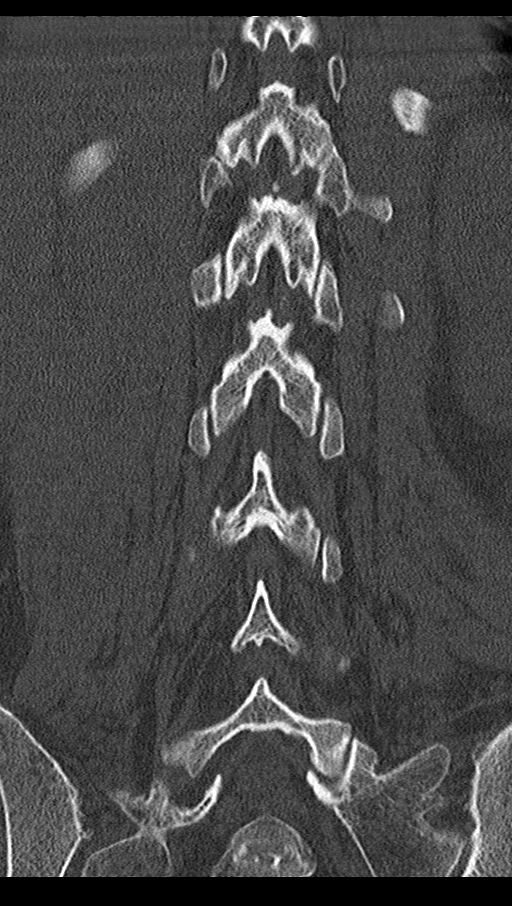
[im 37/61  bone]
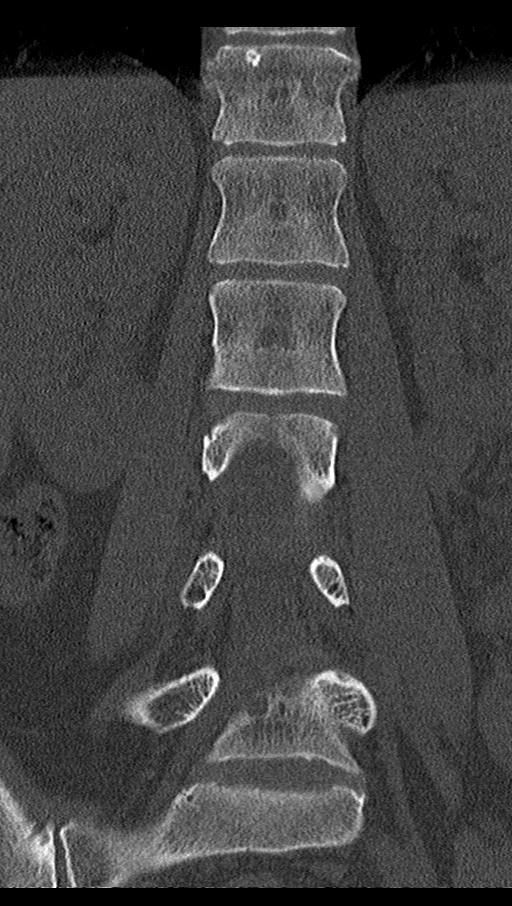

[Series 8: sagittal soft · sagittal · 0.28mm/px · 5 of 68 slices shown, 6 images]
[im 23/68  bone]
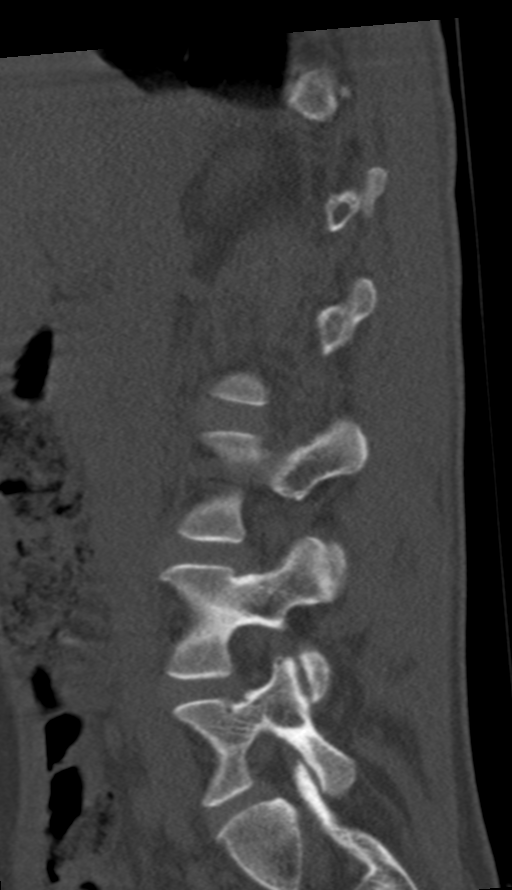
[im 28/68  bone]
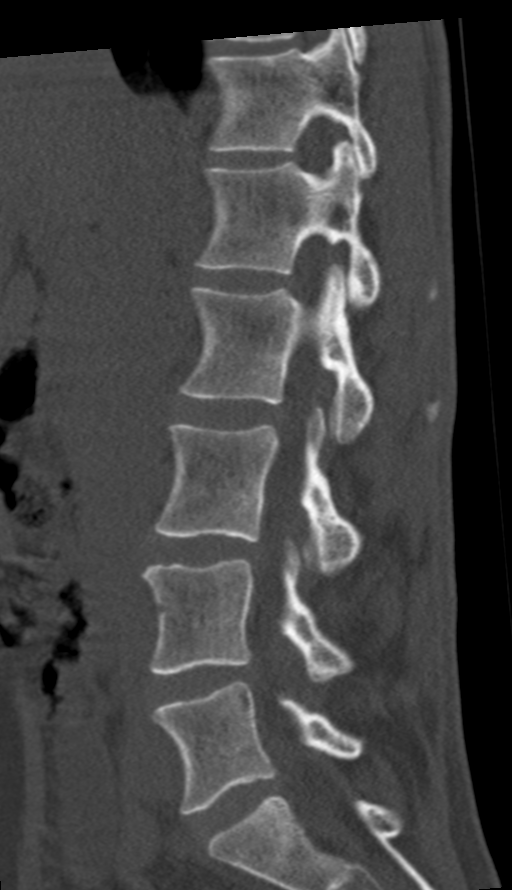
[im 34/68  soft-tissue]
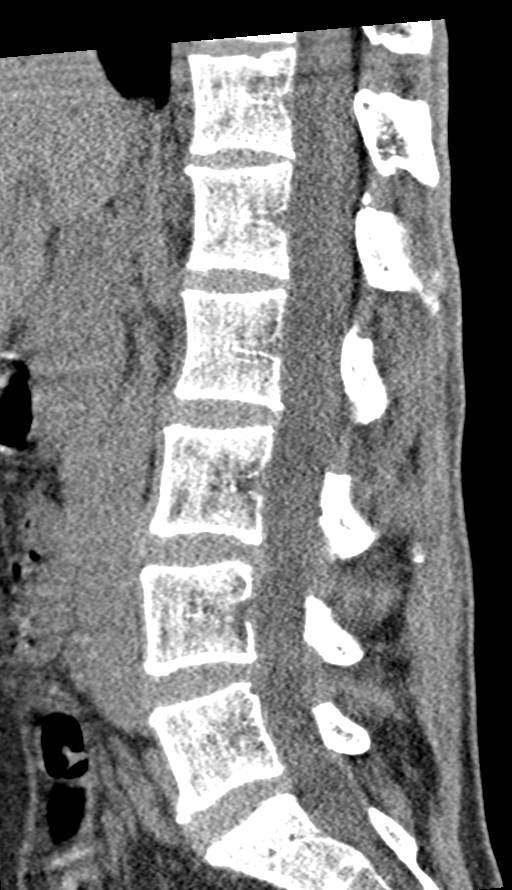
[im 34/68  bone]
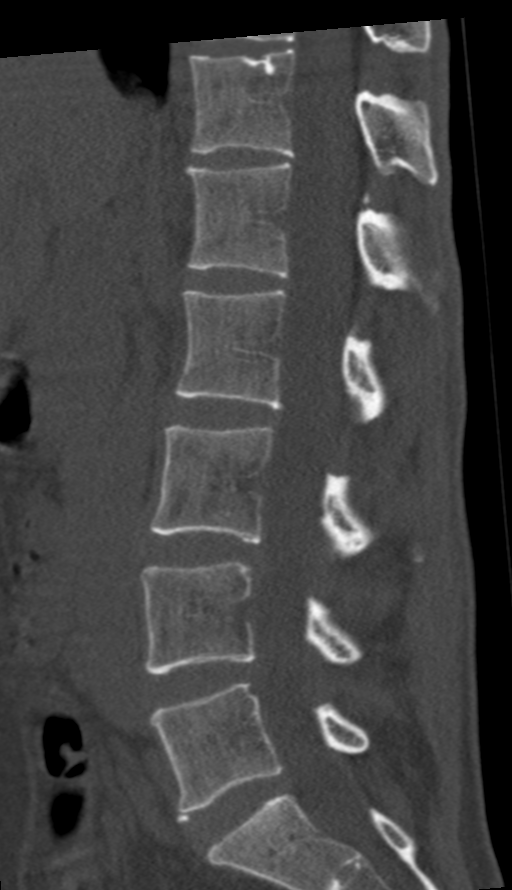
[im 40/68  bone]
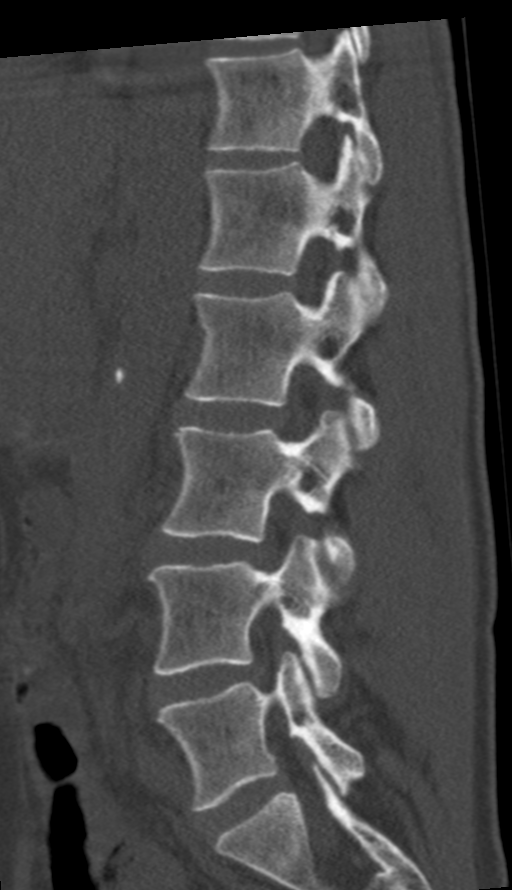
[im 45/68  bone]
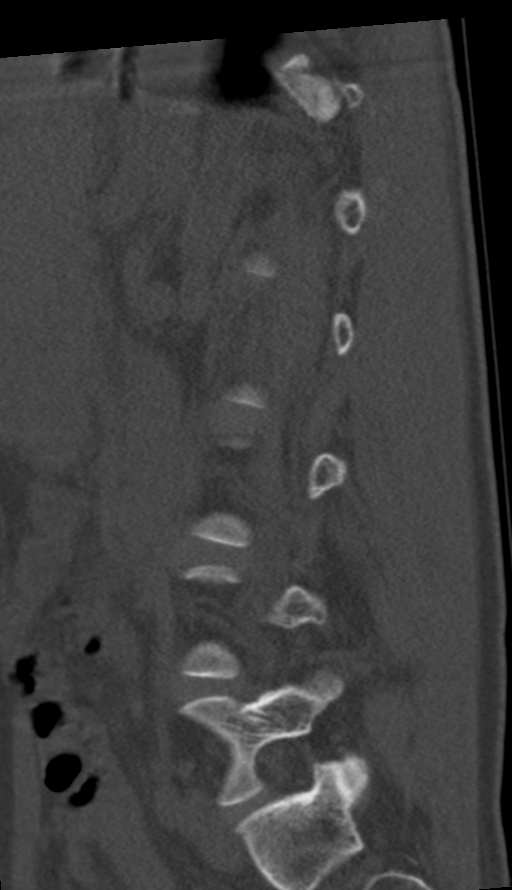

[Series 9: orthogonal axials bone · axial · 0.26mm/px · z∈[-248,-77]mm · 3 of 130 slices shown, 4 images]
[im 22/130  soft-tissue]
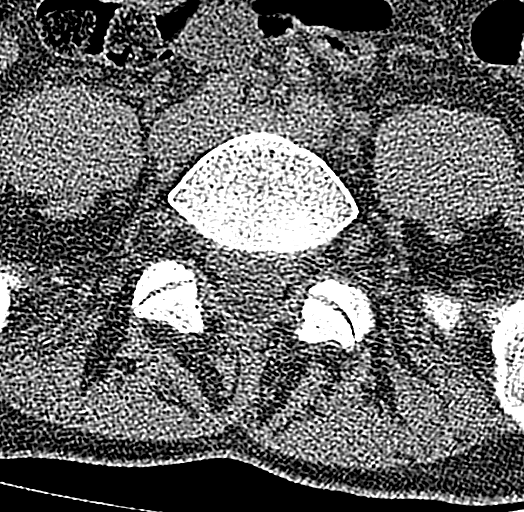
[im 22/130  bone]
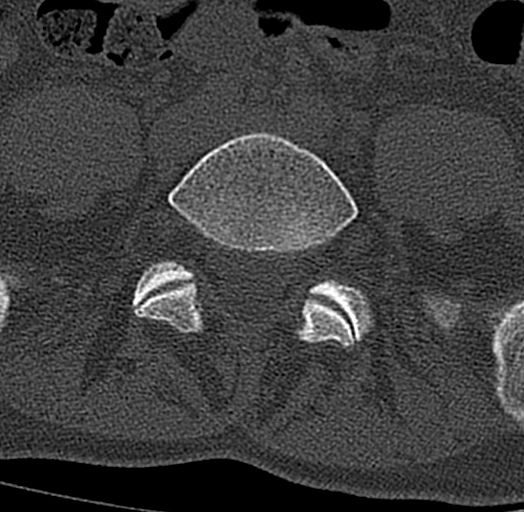
[im 65/130  bone]
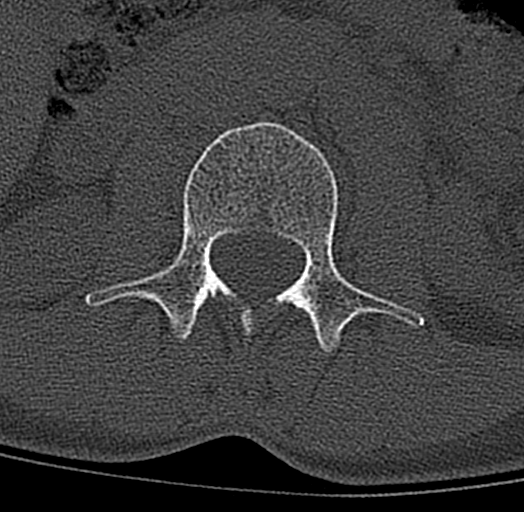
[im 108/130  bone]
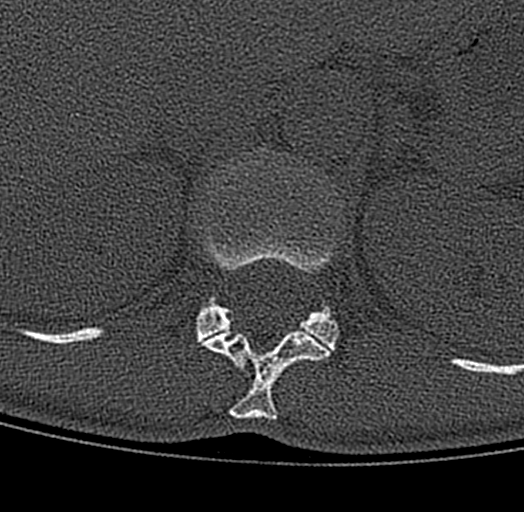

[11 of 33 positions shown; findings below may reference images not displayed]

FINDINGS: Segmentation: There are 5 lumbar type vertebral bodies.

Alignment: Normal.

Vertebrae: Nondisplaced acute avulsion fracture of the right
transverse process (image [DATE]). No other fractures are identified.
The vertebral body heights are maintained.

Paraspinal and other soft tissues: No paraspinal hematoma. Mild
centrilobular emphysema.

Disc levels: The disc heights are preserved. There is mild disc
bulging in the lower lumbar spine without large disc herniation or
significant spinal stenosis.
IMPRESSION: 1. Acute nondisplaced avulsion fracture of the right L1 transverse
process.
2. No other acute lumbar spine findings.
3. Mild disc bulging without large disc herniation or significant
spinal stenosis.

## 2022-07-17 ENCOUNTER — Other Ambulatory Visit: Payer: Self-pay

## 2022-08-12 ENCOUNTER — Ambulatory Visit (INDEPENDENT_AMBULATORY_CARE_PROVIDER_SITE_OTHER): Payer: BC Managed Care – PPO | Admitting: Podiatry

## 2022-08-12 DIAGNOSIS — M778 Other enthesopathies, not elsewhere classified: Secondary | ICD-10-CM

## 2022-08-12 NOTE — Progress Notes (Signed)
Patient presents today to pick up custom molded foot orthotics recommended by Dr. Paulla Dolly.   Orthotics were dispensed and fit was satisfactory. Reviewed instructions for break-in and wear. Written instructions given to patient.  Patient will follow up as needed.   [facility] - order # [id number]

## 2023-12-30 ENCOUNTER — Ambulatory Visit (HOSPITAL_BASED_OUTPATIENT_CLINIC_OR_DEPARTMENT_OTHER): Payer: Self-pay | Admitting: Physical Therapy

## 2024-01-15 ENCOUNTER — Telehealth (HOSPITAL_BASED_OUTPATIENT_CLINIC_OR_DEPARTMENT_OTHER): Payer: Self-pay | Admitting: Physical Therapy

## 2024-01-15 NOTE — Telephone Encounter (Signed)
 Called and spoke to patient to remind patient of upcoming physical therapy evaluation appointment. Pt confirmed appt and will be in attendance.

## 2024-01-18 ENCOUNTER — Encounter (HOSPITAL_BASED_OUTPATIENT_CLINIC_OR_DEPARTMENT_OTHER): Payer: Self-pay | Admitting: Physical Therapy

## 2024-01-18 ENCOUNTER — Ambulatory Visit (HOSPITAL_BASED_OUTPATIENT_CLINIC_OR_DEPARTMENT_OTHER): Payer: Self-pay | Admitting: Physical Therapy

## 2024-01-18 ENCOUNTER — Other Ambulatory Visit: Payer: Self-pay

## 2024-01-18 DIAGNOSIS — M542 Cervicalgia: Secondary | ICD-10-CM | POA: Diagnosis present

## 2024-01-18 DIAGNOSIS — G8929 Other chronic pain: Secondary | ICD-10-CM | POA: Diagnosis present

## 2024-01-18 DIAGNOSIS — R293 Abnormal posture: Secondary | ICD-10-CM | POA: Insufficient documentation

## 2024-01-18 DIAGNOSIS — M4722 Other spondylosis with radiculopathy, cervical region: Secondary | ICD-10-CM | POA: Insufficient documentation

## 2024-01-18 DIAGNOSIS — M25512 Pain in left shoulder: Secondary | ICD-10-CM | POA: Diagnosis present

## 2024-01-18 NOTE — Therapy (Signed)
 OUTPATIENT PHYSICAL THERAPY CERVICAL EVALUATION   Patient Name: Gabriela Young MRN: [id number] DOB:[birth year], 58 y.o., female Today's Date: 01/18/2024  END OF SESSION:  PT End of Session - 01/18/24 1345     Visit Number 1    Number of Visits 17    Date for PT Re-Evaluation 03/14/24    Authorization Type aetna    PT Start Time 1345    PT Stop Time 1430    PT Time Calculation (min) 45 min    Activity Tolerance Patient tolerated treatment well             Past Medical History:  Diagnosis Date   Irritable bowel syndrome (IBS)    Past Surgical History:  Procedure Laterality Date   ABDOMINAL HYSTERECTOMY     BREAST ENHANCEMENT SURGERY     TONSILLECTOMY     Patient Active Problem List   Diagnosis Date Noted   IBS (irritable bowel syndrome) 02/02/2018   Menopausal syndrome 02/02/2018   Atrophic vaginitis 09/25/2017    PCP: Deloris Ping, MD  REFERRING PROVIDER: Ezzard Standing, Earney Navy, NP  REFERRING DIAG: M54.2 (ICD-10-CM) - Cervicalgia M47.22 (ICD-10-CM) - Other spondylosis with radiculopathy, cervical region  THERAPY DIAG:  Cervicalgia  Chronic left shoulder pain  Abnormal posture  Rationale for Evaluation and Treatment: Rehabilitation  ONSET DATE: over a year  SUBJECTIVE:                                                                                                                                                                                                         SUBJECTIVE STATEMENT: Pt states she has had pain over past year or more, gestures towards L upper trap. She also notes some lateral shoulder pain and occasional tingling towards L elbow but not distal - she notes that tingling is pretty infrequent. She states she has had episodes with frozen shoulder before that responded well to PT and she has performed some of those exercises with modest relief at times. She notes most of her pain is at the end of the day, particularly when  having done more work activities. She also notes that symptoms have improved some with her transitioning to more part time clinical practice. She states symptoms tend to wax and wane but do not seem to be worsening overall. Denies any BIL symptoms or red flag symptoms.  Hand dominance: Right  PERTINENT HISTORY:  IBS  PAIN:  Are you having pain: none, does endorse stiffness/tightness  Location/description: L UT mostly - sometimes has lateral shoulder pain and tingling to elbow  Best-worst over past week: 0-4/10  - aggravating factors: end of day, being at computer a lot, lying on side - Easing factors: mornings, gabapentin, stretching   PRECAUTIONS: None  RED FLAGS: None     WEIGHT BEARING RESTRICTIONS: No  FALLS:  Has patient fallen in last 6 months? 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  DIAGNOSTIC FINDINGS:  No recent imaging in chart, pt corroborates  PATIENT SURVEYS:  NDI 3/50, 6%  COGNITION: Overall cognitive status: Within functional limits for tasks assessed  SENSATION: Does not endorse any sensory issues in clinic today  POSTURE: rounded shoulders BIL  PALPATION: Concordant TTP w/ trigger points in L infraspinatus, L UT, L LS   CERVICAL ROM:   ROM A/PROM (deg) eval  Flexion 100%  Extension 75% w/ mild deviation towards R  Right lateral flexion <25% *  Left lateral flexion 50%  Right rotation 52 deg *  Left rotation 60 deg   (Blank rows = not tested) (Key: WFL = within functional limits not  formally assessed, * = concordant pain, s = stiffness/stretching sensation, NT = not tested) Comment:   UPPER EXTREMITY ROM:  A/PROM Right eval Left eval  Shoulder flexion 159 deg 139 deg *  Shoulder abduction 135 deg 132 deg *  Shoulder internal rotation    Shoulder external rotation    Elbow flexion    Elbow extension    Wrist flexion    Wrist extension     (Blank rows = not tested) (Key: WFL = within functional limits not formally assessed, * = concordant pain, s = stiffness/stretching sensation, NT = not tested)  Comments:    UPPER EXTREMITY MMT:  MMT Right eval Left eval  Shoulder flexion 4 4  Shoulder extension 4 4  Shoulder abduction 4 4  Shoulder extension    Shoulder internal rotation 5 5  Shoulder external rotation 5 4*  Elbow flexion    Elbow extension    Grip strength    (Blank rows = not tested)  (Key: WFL = within functional limits not formally assessed, * = concordant pain, s = stiffness/stretching sensation, NT = not tested)  Comments:   CERVICAL SPECIAL TESTS:  Negative spurling's test    TREATMENT DATE:  [facility] Adult PT Treatment:                                                DATE: 01/18/24 Therapeutic Exercise: Double ER + scap retraction, shoulder shrugs, shoulder flexion walkback practice reps + education on appropriate form, comfortable ROM and mechanics; handout provided ; education on relevant anatomy/physiology and rationale for interventions  PATIENT EDUCATION:  Education details: Pt education on PT impairments, prognosis, and POC. Informed consent. Rationale for interventions, safe/appropriate HEP performance Person educated: Patient Education method: Explanation, Demonstration, Tactile cues, Verbal cues Education comprehension: verbalized understanding, returned demonstration, verbal cues required, tactile cues required,  and needs further education    HOME EXERCISE PROGRAM: Access Code: [email or web address] URL: [email or web address] Date: 01/18/2024 Prepared by: Fransisco Hertz  Exercises - Shoulder External Rotation and Scapular Retraction with Resistance  - 2-3 x daily - 1 sets - 8-10 reps - Seated Shoulder Shrugs  - 2-3 x daily - 1 sets - 8-10 reps - Standing 'L' Stretch at Counter  - 2-3 x daily - 1 sets - 8-10 reps  ASSESSMENT:  CLINICAL IMPRESSION: Patient is a pleasant 58 y.o. woman who was seen today for physical therapy evaluation and treatment for neck pain ongoing over past year - does not endorse any overt limitations but states she does tend to have increased pain at the end of the day, particularly when she is working more (at computer). No red flags endorsed today. On exam she demonstrates concordant limitations in cervical/GH mobility, mild RC weakness, postural deficits, and concordant TTP to L UT, LS, and infraspinatus. Tolerates exam/HEP well with no adverse events or increase in pain. Recommend trial of skilled PT to address aforementioned deficits with aim of improving functional tolerance and reducing pain with typical activities. Pt departs today's session in no acute distress, all voiced concerns/questions addressed appropriately from PT perspective.      OBJECTIVE IMPAIRMENTS: decreased activity tolerance, decreased endurance, decreased mobility, decreased ROM, decreased strength, impaired flexibility, impaired UE functional use, postural dysfunction, and pain.   ACTIVITY LIMITATIONS: sitting and sleeping  PARTICIPATION LIMITATIONS: community activity and occupation  PERSONAL FACTORS: Time since onset of injury/illness/exacerbation are also affecting patient's functional outcome.   REHAB POTENTIAL: Good  CLINICAL DECISION MAKING: Stable/uncomplicated  EVALUATION COMPLEXITY: Low   GOALS:  SHORT TERM GOALS: Target date: 02/15/2024  Pt will demonstrate appropriate  understanding and performance of initially prescribed HEP in order to facilitate improved independence with management of symptoms.  Baseline: HEP established  Goal status: INITIAL   2. Pt will report at least 25% improvement in overall pain levels over past week in order to facilitate improved tolerance to typical daily activities.   Baseline: up to 4/10  Goal status: INITIAL    LONG TERM GOALS: Target date: 03/14/2024  Pt will report at least 50% decrease in overall pain levels in past week in order to facilitate improved tolerance to basic ADLs/mobility.   Baseline: up to 4/10  Goal status: INITIAL    2. Pt will demonstrate at least 60 degrees of active cervical rotation ROM bilaterally in order to demonstrate improved environmental awareness and safety with driving.  Baseline: see ROM chart above Goal status: INITIAL  3. Pt will demonstrate symmetrical and painless shoulder MMT for improved symmetry of UE strength and improved tolerance to functional movements.  Baseline: see MMT chart above Goal status: INITIAL   4. Pt will demonstrate at least 150 deg of L shoulder elevation AROM in order to demonstrate improved tolerance to reaching overhead. Baseline: see ROM chart above  Goal status: INITIAL   5. Pt will demonstrate appropriate performance of final prescribed HEP in order to facilitate improved self-management of symptoms post-discharge.   Baseline: initial HEP prescribed  Goal status: INITIAL    6. Pt will endorse at least 50% improvement in sleep quality/quantity due to neck pain without use of pain  medication in order to improve overall health and QOL.   Baseline: able to sleep w/ assistance of medication  Goal status: INITIAL    PLAN:  PT FREQUENCY: 2x/week  PT DURATION: 8 weeks  PLANNED INTERVENTIONS: 97164- PT Re-evaluation, 97110-Therapeutic exercises, 97530- Therapeutic activity, [id number]- Neuromuscular re-education, 97535- Self Care, [id number]- Manual therapy,  [id number]- Electrical stimulation (unattended), 19147- Traction (mechanical), Patient/Family education, Balance training, Stair training, Taping, Dry Needling, Joint mobilization, Spinal mobilization, Cryotherapy, and Moist heat  PLAN FOR NEXT SESSION: Review/update HEP PRN. Work on [employer] exercises as appropriate with emphasis on postural endurance, GH/cervical mobility, RC/periscapular strengthening. 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    Ashley Murrain PT, DPT 01/18/2024 4:26 PM

## 2024-01-19 NOTE — Therapy (Signed)
 OUTPATIENT PHYSICAL THERAPY TREATMENT   Patient Name: Gabriela Young MRN: [id number] DOB:04/24/1966, 58 y.o., female Today's Date: 01/20/2024  END OF SESSION:  PT End of Session - 01/20/24 1258     Visit Number 2    Number of Visits 17    Date for PT Re-Evaluation 03/14/24    Authorization Type aetna    PT Start Time 1302    PT Stop Time 1343    PT Time Calculation (min) 41 min    Activity Tolerance Patient tolerated treatment well              Past Medical History:  Diagnosis Date   Irritable bowel syndrome (IBS)    Past Surgical History:  Procedure Laterality Date   ABDOMINAL HYSTERECTOMY     BREAST ENHANCEMENT SURGERY     TONSILLECTOMY     Patient Active Problem List   Diagnosis Date Noted   IBS (irritable bowel syndrome) 02/02/2018   Menopausal syndrome 02/02/2018   Atrophic vaginitis 09/25/2017    PCP: Deloris Ping, MD  REFERRING PROVIDER: Ezzard Standing, Earney Navy, NP  REFERRING DIAG: M54.2 (ICD-10-CM) - Cervicalgia M47.22 (ICD-10-CM) - Other spondylosis with radiculopathy, cervical region  THERAPY DIAG:  Cervicalgia  Chronic left shoulder pain  Abnormal posture  Rationale for Evaluation and Treatment: Rehabilitation  ONSET DATE: over a year  SUBJECTIVE:                                                                                                                                                                                                        Per eval - Pt states she has had pain over past year or more, gestures towards L upper trap. She also notes some lateral shoulder pain and occasional tingling towards L elbow but not distal - she notes that tingling is pretty infrequent. She states she has had episodes with frozen shoulder before that responded well to PT and she has performed some of those exercises with modest relief at times. She notes most of her pain is at the end of the day, particularly when having done more work  activities. She also notes that symptoms have improved some with her transitioning to more part time clinical practice. She states symptoms tend to wax and wane but do not seem to be worsening overall. Denies any BIL symptoms or red flag symptoms.  Hand dominance: Right  SUBJECTIVE STATEMENT: 01/20/2024 No issues after last session.    PERTINENT HISTORY:  IBS  PAIN:  Are you having pain: none, does endorse stiffness/tightness   Per eval -  Location/description: L UT mostly - sometimes has lateral shoulder pain and tingling to elbow  Best-worst over past week: 0-4/10  - aggravating factors: end of day, being at computer a lot, lying on side - Easing factors: mornings, gabapentin, stretching   PRECAUTIONS: None  RED FLAGS: None     WEIGHT BEARING RESTRICTIONS: No  FALLS:  Has patient fallen in last 6 months? 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  DIAGNOSTIC FINDINGS:  No recent imaging in chart, pt corroborates  PATIENT SURVEYS:  NDI 3/50, 6%  COGNITION: Overall cognitive status: Within functional limits for tasks assessed  SENSATION: Does not endorse any sensory issues in clinic today  POSTURE: rounded shoulders BIL  PALPATION: Concordant TTP w/ trigger points in L infraspinatus, L UT, L LS   CERVICAL ROM:   ROM A/PROM (deg) eval  Flexion 100%  Extension 75% w/ mild deviation towards R  Right lateral flexion <25% *  Left lateral flexion 50%  Right rotation 52 deg *  Left rotation 60 deg   (Blank rows  = not tested) (Key: WFL = within functional limits not formally assessed, * = concordant pain, s = stiffness/stretching sensation, NT = not tested) Comment:   UPPER EXTREMITY ROM:  A/PROM Right eval Left eval  Shoulder flexion 159 deg 139 deg *  Shoulder abduction 135 deg 132 deg *  Shoulder internal rotation    Shoulder external rotation    Elbow flexion    Elbow extension    Wrist flexion    Wrist extension     (Blank rows = not tested) (Key: WFL = within functional limits not formally assessed, * = concordant pain, s = stiffness/stretching sensation, NT = not tested)  Comments:    UPPER EXTREMITY MMT:  MMT Right eval Left eval  Shoulder flexion 4 4  Shoulder extension 4 4  Shoulder abduction 4 4  Shoulder extension    Shoulder internal rotation 5 5  Shoulder external rotation 5 4*  Elbow flexion    Elbow extension    Grip strength    (Blank rows = not tested)  (Key: WFL = within functional limits not formally assessed, * = concordant pain, s = stiffness/stretching sensation, NT = not tested)  Comments:   CERVICAL SPECIAL TESTS:  Negative spurling's test    TREATMENT DATE:  [facility] Adult PT Treatment:                                                DATE: 01/20/24 Therapeutic Exercise: Double ER + scap retraction 2x8 seated and at wall for external cue  Shrug x15, red band shrug x10 emphasis on eccentric portion Shoulder flexion walkbacks x10 Supine chin tucks x15 Supine shoulder flexion AAROM x8 HEP update + education/handout   Neuromuscular re-ed: Cervical sidebending isometrics x8 BIL cues for appropriate force output and setup Green band unilateral row 2x8 BIL cues for periscapular activation/retraction Red band high>low unilat row x10 BIL  [facility] Adult PT Treatment:                                                DATE: 01/18/24 Therapeutic Exercise: Double ER + scap retraction, shoulder shrugs, shoulder flexion walkback practice reps + education on  appropriate form, comfortable ROM and mechanics; handout provided ; education on relevant anatomy/physiology and rationale for interventions                                                                                                                         PATIENT EDUCATION:  Education details: rationale for interventions, HEP  Person educated: Patient Education method: Explanation, Demonstration, Tactile cues, Verbal cues Education comprehension: verbalized understanding, returned demonstration, verbal cues required, tactile cues required, and needs further education     HOME EXERCISE PROGRAM: Access Code: [email or web address] URL: [email or web address] Date: 01/18/2024 Prepared by: Fransisco Hertz  Exercises - Shoulder External Rotation and Scapular Retraction with Resistance  - 2-3 x daily - 1 sets - 8-10 reps - Seated Shoulder Shrugs  - 2-3 x daily - 1 sets - 8-10 reps - Standing 'L' Stretch at Counter  - 2-3 x daily - 1 sets - 8-10 reps  ASSESSMENT:  CLINICAL IMPRESSION: 01/20/2024 Pt arrives w/ stiffness but no pain, no issues after initial eval. Today pt does well with HEP review, min cues required as above. Able to expand program to include cervical isometrics and periscapular strengthening which she does well with, although does require cues for scapular mechanics given tendency for shrugging with retraction. No adverse events, tolerates session well with report of reduced stiffness, no increase in pain; muscle fatigue as expected. Recommend continuing along current POC in order to address relevant deficits and improve functional tolerance. Pt departs today's session in no acute distress, all voiced questions/concerns addressed appropriately from PT perspective.    Per eval - Patient is a pleasant 58 y.o. woman who was seen today for physical therapy evaluation and treatment for neck pain ongoing over past year - does not endorse any overt limitations but states she does tend to  have increased pain at the end of the day, particularly when she is working more (at computer). No red flags endorsed today. On exam she demonstrates concordant limitations in cervical/GH mobility, mild RC weakness, postural deficits, and concordant TTP to L UT, LS, and infraspinatus. Tolerates exam/HEP well with no adverse events or increase in pain. Recommend trial of skilled PT to address aforementioned deficits with aim of improving functional tolerance and reducing pain with typical activities. Pt departs today's session in no acute distress, all voiced concerns/questions addressed appropriately from PT perspective.      OBJECTIVE IMPAIRMENTS: decreased activity tolerance, decreased endurance, decreased mobility, decreased ROM, decreased strength, impaired flexibility, impaired UE functional use, postural dysfunction, and pain.   ACTIVITY LIMITATIONS: sitting and sleeping  PARTICIPATION  LIMITATIONS: community activity and occupation  PERSONAL FACTORS: Time since onset of injury/illness/exacerbation are also affecting patient's functional outcome.   REHAB POTENTIAL: Good  CLINICAL DECISION MAKING: Stable/uncomplicated  EVALUATION COMPLEXITY: Low   GOALS:  SHORT TERM GOALS: Target date: 02/15/2024  Pt will demonstrate appropriate understanding and performance of initially prescribed HEP in order to facilitate improved independence with management of symptoms.  Baseline: HEP established  Goal status: INITIAL   2. Pt will report at least 25% improvement in overall pain levels over past week in order to facilitate improved tolerance to typical daily activities.   Baseline: up to 4/10  Goal status: INITIAL    LONG TERM GOALS: Target date: 03/14/2024  Pt will report at least 50% decrease in overall pain levels in past week in order to facilitate improved tolerance to basic ADLs/mobility.   Baseline: up to 4/10  Goal status: INITIAL    2. Pt will demonstrate at least 60 degrees of  active cervical rotation ROM bilaterally in order to demonstrate improved environmental awareness and safety with driving.  Baseline: see ROM chart above Goal status: INITIAL  3. Pt will demonstrate symmetrical and painless shoulder MMT for improved symmetry of UE strength and improved tolerance to functional movements.  Baseline: see MMT chart above Goal status: INITIAL   4. Pt will demonstrate at least 150 deg of L shoulder elevation AROM in order to demonstrate improved tolerance to reaching overhead. Baseline: see ROM chart above  Goal status: INITIAL   5. Pt will demonstrate appropriate performance of final prescribed HEP in order to facilitate improved self-management of symptoms post-discharge.   Baseline: initial HEP prescribed  Goal status: INITIAL    6. Pt will endorse at least 50% improvement in sleep quality/quantity due to neck pain without use of pain medication in order to improve overall health and QOL.   Baseline: able to sleep w/ assistance of medication  Goal status: INITIAL    PLAN:  PT FREQUENCY: 2x/week  PT DURATION: 8 weeks  PLANNED INTERVENTIONS: 97164- PT Re-evaluation, 97110-Therapeutic exercises, 97530- Therapeutic activity, [id number]- Neuromuscular re-education, 97535- Self Care, 40981- Manual therapy, [id number]- Electrical stimulation (unattended), 19147- Traction (mechanical), Patient/Family education, Balance training, Stair training, Taping, Dry Needling, Joint mobilization, Spinal mobilization, Cryotherapy, and Moist heat  PLAN FOR NEXT SESSION: Review/update HEP PRN. Work on [employer] exercises as appropriate with emphasis on postural endurance, GH/cervical mobility, RC/periscapular strengthening. 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    Ashley Murrain PT, DPT 01/20/2024 1:45 PM

## 2024-01-20 ENCOUNTER — Encounter (HOSPITAL_BASED_OUTPATIENT_CLINIC_OR_DEPARTMENT_OTHER): Payer: Self-pay | Admitting: Physical Therapy

## 2024-01-20 ENCOUNTER — Ambulatory Visit (HOSPITAL_BASED_OUTPATIENT_CLINIC_OR_DEPARTMENT_OTHER): Admitting: Physical Therapy

## 2024-01-20 DIAGNOSIS — R293 Abnormal posture: Secondary | ICD-10-CM

## 2024-01-20 DIAGNOSIS — M542 Cervicalgia: Secondary | ICD-10-CM

## 2024-01-20 DIAGNOSIS — G8929 Other chronic pain: Secondary | ICD-10-CM

## 2024-01-26 ENCOUNTER — Encounter (HOSPITAL_BASED_OUTPATIENT_CLINIC_OR_DEPARTMENT_OTHER): Payer: Self-pay | Admitting: Physical Therapy

## 2024-01-26 ENCOUNTER — Ambulatory Visit (HOSPITAL_BASED_OUTPATIENT_CLINIC_OR_DEPARTMENT_OTHER): Admitting: Physical Therapy

## 2024-01-26 DIAGNOSIS — M542 Cervicalgia: Secondary | ICD-10-CM

## 2024-01-26 DIAGNOSIS — G8929 Other chronic pain: Secondary | ICD-10-CM

## 2024-01-26 DIAGNOSIS — R293 Abnormal posture: Secondary | ICD-10-CM

## 2024-01-26 NOTE — Therapy (Incomplete)
 OUTPATIENT PHYSICAL THERAPY TREATMENT   Patient Name: Gabriela Young MRN: [id number] DOB:09-Jan-1966, 58 y.o., female Today's Date: 01/26/2024  END OF SESSION:     Past Medical History:  Diagnosis Date   Irritable bowel syndrome (IBS)    Past Surgical History:  Procedure Laterality Date   ABDOMINAL HYSTERECTOMY     BREAST ENHANCEMENT SURGERY     TONSILLECTOMY     Patient Active Problem List   Diagnosis Date Noted   IBS (irritable bowel syndrome) 02/02/2018   Menopausal syndrome 02/02/2018   Atrophic vaginitis 09/25/2017    PCP: Deloris Ping, MD  REFERRING PROVIDER: Ezzard Standing, Earney Navy, NP  REFERRING DIAG: M54.2 (ICD-10-CM) - Cervicalgia M47.22 (ICD-10-CM) - Other spondylosis with radiculopathy, cervical region  THERAPY DIAG:  No diagnosis found.  Rationale for Evaluation and Treatment: Rehabilitation  ONSET DATE: over a year  SUBJECTIVE:                                                                                                                                                                                                        Per eval - Pt states she has had pain over past year or more, gestures towards L upper trap. She also notes some lateral shoulder pain and occasional tingling towards L elbow but not distal - she notes that tingling is pretty infrequent. She states she has had episodes with frozen shoulder before that responded well to PT and she has performed some of those exercises with modest relief at times. She notes most of her pain is at the end of the day, particularly when having done more work activities. She also notes that symptoms have improved some with her transitioning to more part time clinical practice. She states symptoms tend to wax and wane but do not seem to be worsening overall. Denies any BIL symptoms or red flag symptoms.  Hand dominance: Right  SUBJECTIVE STATEMENT: 01/26/2024 No issues after last session. Still  having soreness in that L UT that is persistent.    PERTINENT HISTORY:  IBS  PAIN:  Are you having pain: none, does endorse stiffness/tightness   Per eval -  Location/description: L UT mostly - sometimes has lateral shoulder pain and tingling to elbow  Best-worst over past week: 0-4/10  - aggravating factors: end of day, being at computer a lot, lying on side - Easing factors: mornings, gabapentin, stretching   PRECAUTIONS: None  RED FLAGS: None     WEIGHT BEARING RESTRICTIONS: No  FALLS:  Has patient fallen in last 6 months? 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  DIAGNOSTIC FINDINGS:  No recent imaging in chart, pt corroborates  PATIENT SURVEYS:  NDI 3/50, 6%  COGNITION: Overall cognitive status: Within functional limits for tasks assessed  SENSATION: Does not endorse any sensory issues in clinic today  POSTURE: rounded shoulders BIL  PALPATION: Concordant TTP w/ trigger points in L infraspinatus, L UT, L LS   CERVICAL ROM:   ROM A/PROM (deg) eval  Flexion 100%  Extension 75% w/ mild deviation towards R  Right lateral flexion <25% *  Left lateral flexion 50%  Right rotation 52 deg *  Left rotation 60 deg   (Blank rows = not tested) (Key: WFL = within functional limits not formally assessed, * = concordant pain, s = stiffness/stretching sensation, NT = not tested) Comment:   UPPER EXTREMITY ROM:  A/PROM Right eval Left eval  Shoulder flexion 159 deg 139 deg *  Shoulder abduction 135 deg 132  deg *  Shoulder internal rotation    Shoulder external rotation    Elbow flexion    Elbow extension    Wrist flexion    Wrist extension     (Blank rows = not tested) (Key: WFL = within functional limits not formally assessed, * = concordant pain, s = stiffness/stretching sensation, NT = not tested)  Comments:    UPPER EXTREMITY MMT:  MMT Right eval Left eval  Shoulder flexion 4 4  Shoulder extension 4 4  Shoulder abduction 4 4  Shoulder extension    Shoulder internal rotation 5 5  Shoulder external rotation 5 4*  Elbow flexion    Elbow extension    Grip strength    (Blank rows = not tested)  (Key: WFL = within functional limits not formally assessed, * = concordant pain, s = stiffness/stretching sensation, NT = not tested)  Comments:   CERVICAL SPECIAL TESTS:  Negative spurling's test    TREATMENT DATE:  [facility] Adult PT Treatment:                                                 DATE: 01/26/24 Manual:  Trigger Point Dry Needling STM to UT Joint mobilization to cervical and thoracic region There-ex:  There-Act  Self Care  Nuro-Re-ed   DATE: 01/20/24 Therapeutic Exercise: Double ER + scap retraction 2x8 seated and at wall for external cue  Shrug x15, red band shrug x10 emphasis on eccentric portion Shoulder flexion walkbacks x10 Supine chin tucks x15 Supine shoulder flexion AAROM x8 HEP update + education/handout   Neuromuscular re-ed: Cervical sidebending isometrics x8 BIL cues for appropriate force output and setup Green band unilateral row 2x8 BIL cues for periscapular activation/retraction Red band high>low unilat row x10 BIL     [facility] Adult PT Treatment:  DATE: 01/18/24 Therapeutic Exercise: Double ER + scap retraction, shoulder shrugs, shoulder flexion walkback practice reps + education on appropriate form, comfortable ROM and mechanics; handout provided ; education on relevant anatomy/physiology and  rationale for interventions                                                                                                                         PATIENT EDUCATION:  Education details: rationale for interventions, HEP  Person educated: Patient Education method: Explanation, Demonstration, Tactile cues, Verbal cues Education comprehension: verbalized understanding, returned demonstration, verbal cues required, tactile cues required, and needs further education     HOME EXERCISE PROGRAM: Access Code: [email or web address] URL: [email or web address] Date: 01/18/2024 Prepared by: Fransisco Hertz  Exercises - Shoulder External Rotation and Scapular Retraction with Resistance  - 2-3 x daily - 1 sets - 8-10 reps - Seated Shoulder Shrugs  - 2-3 x daily - 1 sets - 8-10 reps - Standing 'L' Stretch at Counter  - 2-3 x daily - 1 sets - 8-10 reps  ASSESSMENT:  CLINICAL IMPRESSION: 01/26/2024 Pt arrives w/ stiffness but no pain, no issues after initial eval. Today pt does well with HEP review, min cues required as above. Able to expand program to include cervical isometrics and periscapular strengthening which she does well with, although does require cues for scapular mechanics given tendency for shrugging with retraction. No adverse events, tolerates session well with report of reduced stiffness, no increase in pain; muscle fatigue as expected. Recommend continuing along current POC in order to address relevant deficits and improve functional tolerance. Pt departs today's session in no acute distress, all voiced questions/concerns addressed appropriately from PT perspective.    Per eval - Patient is a pleasant 58 y.o. woman who was seen today for physical therapy evaluation and treatment for neck pain ongoing over past year - does not endorse any overt limitations but states she does tend to have increased pain at the end of the day, particularly when she is working more (at computer). No red flags  endorsed today. On exam she demonstrates concordant limitations in cervical/GH mobility, mild RC weakness, postural deficits, and concordant TTP to L UT, LS, and infraspinatus. Tolerates exam/HEP well with no adverse events or increase in pain. Recommend trial of skilled PT to address aforementioned deficits with aim of improving functional tolerance and reducing pain with typical activities. Pt departs today's session in no acute distress, all voiced concerns/questions addressed appropriately from PT perspective.      OBJECTIVE IMPAIRMENTS: decreased activity tolerance, decreased endurance, decreased mobility, decreased ROM, decreased strength, impaired flexibility, impaired UE functional use, postural dysfunction, and pain.   ACTIVITY LIMITATIONS: sitting and sleeping  PARTICIPATION LIMITATIONS: community activity and occupation  PERSONAL FACTORS: Time since onset of injury/illness/exacerbation are also affecting patient's functional outcome.   REHAB POTENTIAL: Good  CLINICAL DECISION MAKING: Stable/uncomplicated  EVALUATION COMPLEXITY: Low   GOALS:  SHORT TERM GOALS: Target date: 02/15/2024  Pt will demonstrate appropriate understanding and performance  of initially prescribed HEP in order to facilitate improved independence with management of symptoms.  Baseline: HEP established  Goal status: INITIAL   2. Pt will report at least 25% improvement in overall pain levels over past week in order to facilitate improved tolerance to typical daily activities.   Baseline: up to 4/10  Goal status: INITIAL    LONG TERM GOALS: Target date: 03/14/2024  Pt will report at least 50% decrease in overall pain levels in past week in order to facilitate improved tolerance to basic ADLs/mobility.   Baseline: up to 4/10  Goal status: INITIAL    2. Pt will demonstrate at least 60 degrees of active cervical rotation ROM bilaterally in order to demonstrate improved environmental awareness and safety with  driving.  Baseline: see ROM chart above Goal status: INITIAL  3. Pt will demonstrate symmetrical and painless shoulder MMT for improved symmetry of UE strength and improved tolerance to functional movements.  Baseline: see MMT chart above Goal status: INITIAL   4. Pt will demonstrate at least 150 deg of L shoulder elevation AROM in order to demonstrate improved tolerance to reaching overhead. Baseline: see ROM chart above  Goal status: INITIAL   5. Pt will demonstrate appropriate performance of final prescribed HEP in order to facilitate improved self-management of symptoms post-discharge.   Baseline: initial HEP prescribed  Goal status: INITIAL    6. Pt will endorse at least 50% improvement in sleep quality/quantity due to neck pain without use of pain medication in order to improve overall health and QOL.   Baseline: able to sleep w/ assistance of medication  Goal status: INITIAL    PLAN:  PT FREQUENCY: 2x/week  PT DURATION: 8 weeks  PLANNED INTERVENTIONS: 97164- PT Re-evaluation, 97110-Therapeutic exercises, 97530- Therapeutic activity, [id number]- Neuromuscular re-education, 97535- Self Care, 16109- Manual therapy, [id number]- Electrical stimulation (unattended), 60454- Traction (mechanical), Patient/Family education, Balance training, Stair training, Taping, Dry Needling, Joint mobilization, Spinal mobilization, Cryotherapy, and Moist heat  PLAN FOR NEXT SESSION: Review/update HEP PRN. Work on [employer] exercises as appropriate with emphasis on postural endurance, GH/cervical mobility, RC/periscapular strengthening. 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    Ashley Murrain PT, DPT 01/26/2024 4:12 PM

## 2024-01-26 NOTE — Patient Instructions (Signed)

## 2024-01-27 ENCOUNTER — Encounter (HOSPITAL_BASED_OUTPATIENT_CLINIC_OR_DEPARTMENT_OTHER): Payer: Self-pay | Admitting: Physical Therapy

## 2024-01-27 NOTE — Therapy (Incomplete)
 OUTPATIENT PHYSICAL THERAPY TREATMENT   Patient Name: Gabriela Young MRN: [id number] DOB:11/11/1965, 58 y.o., female Today's Date: 01/27/2024  END OF SESSION:  PT End of Session - 01/26/24 1627     Visit Number 3    Number of Visits 17    Date for PT Re-Evaluation 03/14/24    Authorization Type aetna    PT Start Time 1600    PT Stop Time 1642    PT Time Calculation (min) 42 min    Activity Tolerance Patient tolerated treatment well    Behavior During Therapy WFL for tasks assessed/performed              Past Medical History:  Diagnosis Date   Irritable bowel syndrome (IBS)    Past Surgical History:  Procedure Laterality Date   ABDOMINAL HYSTERECTOMY     BREAST ENHANCEMENT SURGERY     TONSILLECTOMY     Patient Active Problem List   Diagnosis Date Noted   IBS (irritable bowel syndrome) 02/02/2018   Menopausal syndrome 02/02/2018   Atrophic vaginitis 09/25/2017    PCP: Deloris Ping, MD  REFERRING PROVIDER: Ezzard Standing, Earney Navy, NP  REFERRING DIAG: M54.2 (ICD-10-CM) - Cervicalgia M47.22 (ICD-10-CM) - Other spondylosis with radiculopathy, cervical region  THERAPY DIAG:  Cervicalgia  Chronic left shoulder pain  Abnormal posture  Rationale for Evaluation and Treatment: Rehabilitation  ONSET DATE: over a year  SUBJECTIVE:                                                                                                                                                                                                        Per eval - Pt states she has had pain over past year or more, gestures towards L upper trap. She also notes some lateral shoulder pain and occasional tingling towards L elbow but not distal - she notes that tingling is pretty infrequent. She states she has had episodes with frozen shoulder before that responded well to PT and she has performed some of those exercises with modest relief at times. She notes most of her pain is at the  end of the day, particularly when having done more work activities. She also notes that symptoms have improved some with her transitioning to more part time clinical practice. She states symptoms tend to wax and wane but do not seem to be worsening overall. Denies any BIL symptoms or red flag symptoms.  Hand dominance: Right  SUBJECTIVE STATEMENT: 01/27/2024 No issues after last session.    PERTINENT HISTORY:  IBS  PAIN:  Are you  having pain: none, does endorse stiffness/tightness   Per eval -  Location/description: L UT mostly - sometimes has lateral shoulder pain and tingling to elbow  Best-worst over past week: 0-4/10  - aggravating factors: end of day, being at computer a lot, lying on side - Easing factors: mornings, gabapentin, stretching   PRECAUTIONS: None  RED FLAGS: None     WEIGHT BEARING RESTRICTIONS: No  FALLS:  Has patient fallen in last 6 months? 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  DIAGNOSTIC FINDINGS:  No recent imaging in chart, pt corroborates  PATIENT SURVEYS:  NDI 3/50, 6%  COGNITION: Overall cognitive status: Within functional limits for tasks assessed  SENSATION: Does not endorse any sensory issues in clinic today  POSTURE: rounded shoulders BIL  PALPATION: Concordant TTP w/ trigger points in L infraspinatus, L UT, L LS   CERVICAL ROM:   ROM A/PROM (deg) eval  Flexion 100%  Extension 75% w/ mild deviation towards R  Right lateral flexion <25% *  Left lateral flexion 50%  Right  rotation 52 deg *  Left rotation 60 deg   (Blank rows = not tested) (Key: WFL = within functional limits not formally assessed, * = concordant pain, s = stiffness/stretching sensation, NT = not tested) Comment:   UPPER EXTREMITY ROM:  A/PROM Right eval Left eval  Shoulder flexion 159 deg 139 deg *  Shoulder abduction 135 deg 132 deg *  Shoulder internal rotation    Shoulder external rotation    Elbow flexion    Elbow extension    Wrist flexion    Wrist extension     (Blank rows = not tested) (Key: WFL = within functional limits not formally assessed, * = concordant pain, s = stiffness/stretching sensation, NT = not tested)  Comments:    UPPER EXTREMITY MMT:  MMT Right eval Left eval  Shoulder flexion 4 4  Shoulder extension 4 4  Shoulder abduction 4 4  Shoulder extension    Shoulder internal rotation 5 5  Shoulder external rotation 5 4*  Elbow flexion    Elbow extension    Grip strength    (Blank rows = not tested)  (Key: WFL = within functional limits not formally assessed, * = concordant pain, s = stiffness/stretching sensation, NT = not tested)  Comments:   CERVICAL SPECIAL TESTS:  Negative spurling's test    TREATMENT DATE:  [facility] Adult PT Treatment:                                                DATE: 01/20/24 Therapeutic Exercise: Double ER + scap retraction 2x8 seated and at wall for external cue  Shrug x15, red band shrug x10 emphasis on eccentric portion Shoulder flexion walkbacks x10 Supine chin tucks x15 Supine shoulder flexion AAROM x8 HEP update + education/handout   Neuromuscular re-ed: Cervical sidebending isometrics x8 BIL cues for appropriate force output and setup Green band unilateral row 2x8 BIL cues for  periscapular activation/retraction Red band high>low unilat row x10 BIL     [facility] Adult PT Treatment:                                                DATE: 01/18/24 Therapeutic Exercise: Double ER + scap retraction, shoulder shrugs,  shoulder flexion walkback practice reps + education on appropriate form, comfortable ROM and mechanics; handout provided ; education on relevant anatomy/physiology and rationale for interventions                                                                                                                         PATIENT EDUCATION:  Education details: rationale for interventions, HEP  Person educated: Patient Education method: Explanation, Demonstration, Tactile cues, Verbal cues Education comprehension: verbalized understanding, returned demonstration, verbal cues required, tactile cues required, and needs further education     HOME EXERCISE PROGRAM: Access Code: [email or web address] URL: [email or web address] Date: 01/18/2024 Prepared by: Fransisco Hertz  Exercises - Shoulder External Rotation and Scapular Retraction with Resistance  - 2-3 x daily - 1 sets - 8-10 reps - Seated Shoulder Shrugs  - 2-3 x daily - 1 sets - 8-10 reps - Standing 'L' Stretch at Counter  - 2-3 x daily - 1 sets - 8-10 reps  ASSESSMENT:  CLINICAL IMPRESSION: Therapy performed trial of TPDN to upper trap. She had a great twitch. She had a palpable decrease in  Per eval - Patient is a pleasant 58 y.o. woman who was seen today for physical therapy evaluation and treatment for neck pain ongoing over past year - does not endorse any overt limitations but states she does tend to have increased pain at the end of the day, particularly when she is working more (at computer). No red flags endorsed today. On exam she demonstrates concordant limitations in cervical/GH mobility, mild RC weakness, postural deficits, and concordant TTP to L UT, LS, and infraspinatus. Tolerates exam/HEP well with no adverse events or increase in pain. Recommend trial of skilled PT to address aforementioned deficits with aim of improving functional tolerance and reducing pain with typical activities. Pt departs today's session in no acute distress,  all voiced concerns/questions addressed appropriately from PT perspective.      OBJECTIVE IMPAIRMENTS: decreased activity tolerance, decreased endurance, decreased mobility, decreased ROM, decreased strength, impaired flexibility, impaired UE functional use, postural dysfunction, and pain.   ACTIVITY LIMITATIONS: sitting and sleeping  PARTICIPATION LIMITATIONS: community activity and occupation  PERSONAL FACTORS: Time since onset of injury/illness/exacerbation are also affecting patient's functional outcome.   REHAB POTENTIAL: Good  CLINICAL DECISION MAKING: Stable/uncomplicated  EVALUATION COMPLEXITY: Low   GOALS:  SHORT TERM GOALS: Target date: 02/15/2024  Pt will demonstrate appropriate understanding and performance of initially prescribed HEP in order to facilitate improved independence with management of symptoms.  Baseline: HEP established  Goal status: INITIAL  2. Pt will report at least 25% improvement in overall pain levels over past week in order to facilitate improved tolerance to typical daily activities.   Baseline: up to 4/10  Goal status: INITIAL    LONG TERM GOALS: Target date: 03/14/2024  Pt will report at least 50% decrease in overall pain levels in past week in order to facilitate improved tolerance to basic ADLs/mobility.   Baseline: up to 4/10  Goal status: INITIAL    2. Pt will demonstrate at least 60 degrees of active cervical rotation ROM bilaterally in order to demonstrate improved environmental awareness and safety with driving.  Baseline: see ROM chart above Goal status: INITIAL  3. Pt will demonstrate symmetrical and painless shoulder MMT for improved symmetry of UE strength and improved tolerance to functional movements.  Baseline: see MMT chart above Goal status: INITIAL   4. Pt will demonstrate at least 150 deg of L shoulder elevation AROM in order to demonstrate improved tolerance to reaching overhead. Baseline: see ROM chart above  Goal  status: INITIAL   5. Pt will demonstrate appropriate performance of final prescribed HEP in order to facilitate improved self-management of symptoms post-discharge.   Baseline: initial HEP prescribed  Goal status: INITIAL    6. Pt will endorse at least 50% improvement in sleep quality/quantity due to neck pain without use of pain medication in order to improve overall health and QOL.   Baseline: able to sleep w/ assistance of medication  Goal status: INITIAL    PLAN:  PT FREQUENCY: 2x/week  PT DURATION: 8 weeks  PLANNED INTERVENTIONS: 97164- PT Re-evaluation, 97110-Therapeutic exercises, 97530- Therapeutic activity, [id number]- Neuromuscular re-education, 97535- Self Care, 16109- Manual therapy, G0283- Electrical stimulation (unattended), 60454- Traction (mechanical), Patient/Family education, Balance training, Stair training, Taping, Dry Needling, Joint mobilization, Spinal mobilization, Cryotherapy, and Moist heat  PLAN FOR NEXT SESSION: Review/update HEP PRN. Work on [employer] exercises as appropriate with emphasis on postural endurance, GH/cervical mobility, RC/periscapular strengthening. 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    Lorayne Bender PT DPT 01/27/2024 1:55 PM   Landry Corporal SPT   I have reviewed and concur with this student's documentation.   Dessie Coma, PT 01/27/2024 1:56 PM   During this treatment session, the therapist was present, participating in and directing the treatment.

## 2024-01-28 ENCOUNTER — Encounter (HOSPITAL_BASED_OUTPATIENT_CLINIC_OR_DEPARTMENT_OTHER): Payer: Self-pay | Admitting: Physical Therapy

## 2024-01-28 NOTE — Therapy (Signed)
 OUTPATIENT PHYSICAL THERAPY TREATMENT   Patient Name: Gabriela Young MRN: [id number] DOB:07/05/1966, 58 y.o., female Today's Date: 01/31/2024  END OF SESSION:  PT End of Session - 01/31/24 1253     Visit Number 3    Number of Visits 17    Date for PT Re-Evaluation 03/14/24    Authorization Type aetna    PT Start Time 1600    PT Stop Time 1643    PT Time Calculation (min) 43 min    Activity Tolerance Patient tolerated treatment well    Behavior During Therapy WFL for tasks assessed/performed               Past Medical History:  Diagnosis Date   Irritable bowel syndrome (IBS)    Past Surgical History:  Procedure Laterality Date   ABDOMINAL HYSTERECTOMY     BREAST ENHANCEMENT SURGERY     TONSILLECTOMY     Patient Active Problem List   Diagnosis Date Noted   IBS (irritable bowel syndrome) 02/02/2018   Menopausal syndrome 02/02/2018   Atrophic vaginitis 09/25/2017    PCP: Deloris Ping, MD  REFERRING PROVIDER: Ezzard Standing, Earney Navy, NP  REFERRING DIAG: M54.2 (ICD-10-CM) - Cervicalgia M47.22 (ICD-10-CM) - Other spondylosis with radiculopathy, cervical region  THERAPY DIAG:  Cervicalgia  Chronic left shoulder pain  Abnormal posture  Rationale for Evaluation and Treatment: Rehabilitation  ONSET DATE: over a year  SUBJECTIVE:                                                                                                                                                                                                        Per eval - Pt states she has had pain over past year or more, gestures towards L upper trap. She also notes some lateral shoulder pain and occasional tingling towards L elbow but not distal - she notes that tingling is pretty infrequent. She states she has had episodes with frozen shoulder before that responded well to PT and she has performed some of those exercises with modest relief at times. She notes most of her pain is at  the end of the day, particularly when having done more work activities. She also notes that symptoms have improved some with her transitioning to more part time clinical practice. She states symptoms tend to wax and wane but do not seem to be worsening overall. Denies any BIL symptoms or red flag symptoms.  Hand dominance: Right  SUBJECTIVE STATEMENT:  No issues after last session. The patient still feels it but the pain is improving. She  continues to have pain in her left upper trap   PERTINENT HISTORY:  IBS  PAIN:  Are you having pain: none, does endorse stiffness/tightness   Per eval -  Location/description: L UT mostly - sometimes has lateral shoulder pain and tingling to elbow  Best-worst over past week: 0-4/10  - aggravating factors: end of day, being at computer a lot, lying on side - Easing factors: mornings, gabapentin, stretching   PRECAUTIONS: None  RED FLAGS: None     WEIGHT BEARING RESTRICTIONS: No  FALLS:  Has patient fallen in last 6 months? 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  DIAGNOSTIC FINDINGS:  No recent imaging in chart, pt corroborates  PATIENT SURVEYS:  NDI 3/50, 6%  COGNITION: Overall cognitive status: Within functional limits for tasks assessed  SENSATION: Does not endorse any sensory issues in clinic today  POSTURE: rounded shoulders BIL  PALPATION: Concordant TTP w/ trigger points in L infraspinatus, L UT, L LS   CERVICAL ROM:   ROM A/PROM (deg) eval  Flexion 100%  Extension  75% w/ mild deviation towards R  Right lateral flexion <25% *  Left lateral flexion 50%  Right rotation 52 deg *  Left rotation 60 deg   (Blank rows = not tested) (Key: WFL = within functional limits not formally assessed, * = concordant pain, s = stiffness/stretching sensation, NT = not tested) Comment:   UPPER EXTREMITY ROM:  A/PROM Right eval Left eval  Shoulder flexion 159 deg 139 deg *  Shoulder abduction 135 deg 132 deg *  Shoulder internal rotation    Shoulder external rotation    Elbow flexion    Elbow extension    Wrist flexion    Wrist extension     (Blank rows = not tested) (Key: WFL = within functional limits not formally assessed, * = concordant pain, s = stiffness/stretching sensation, NT = not tested)  Comments:    UPPER EXTREMITY MMT:  MMT Right eval Left eval  Shoulder flexion 4 4  Shoulder extension 4 4  Shoulder abduction 4 4  Shoulder extension    Shoulder internal rotation 5 5  Shoulder external rotation 5 4*  Elbow flexion    Elbow extension    Grip strength    (Blank rows = not tested)  (Key: WFL = within functional limits not formally assessed, * = concordant pain, s = stiffness/stretching sensation, NT = not tested)  Comments:   CERVICAL SPECIAL TESTS:  Negative spurling's test    TREATMENT DATE:  3/28  Trigger Point Dry Needling  Initial Treatment: Pt instructed on Dry Needling rational, procedures, and possible side effects. Pt instructed to expect mild to moderate muscle soreness later in the day and/or into the next day.  Pt instructed in methods to reduce muscle soreness. Pt instructed to continue prescribed HEP. Because Dry Needling was performed over or adjacent to a lung field, pt was educated on S/S of pneumothorax and to seek immediate medical attention should they occur.  Patient was educated on signs and symptoms of infection and other risk factors and advised to seek medical  attention should they occur.  Patient  verbalized understanding of these instructions and education.   Patient Verbal Consent Given: Yes Education Handout Provided: Yes Muscles Treated: left 3x in left upper trap  Electrical Stimulation Performed: No Treatment Response/Outcome: great twitch   Manual: skilled palpation of trigger point Review of self trigger point release  Reviewed of thera-cane and where to purchase  Neuro re-ed  Row machine 3x12 30 lbs  Cable extension 3x12 30 lbs  Reviewed use of RPE to grade exercises all exercises grade at a 4-5 RPE      [facility] Adult PT Treatment:                                                DATE: 01/20/24 Therapeutic Exercise: Double ER + scap retraction 2x8 seated and at wall for external cue  Shrug x15, red band shrug x10 emphasis on eccentric portion Shoulder flexion walkbacks x10 Supine chin tucks x15 Supine shoulder flexion AAROM x8 HEP update + education/handout   Neuromuscular re-ed: Cervical sidebending isometrics x8 BIL cues for appropriate force output and setup Green band unilateral row 2x8 BIL cues for periscapular activation/retraction Red band high>low unilat row x10 BIL     [facility] Adult PT Treatment:                                                DATE: 01/18/24 Therapeutic Exercise: Double ER + scap retraction, shoulder shrugs, shoulder flexion walkback practice reps + education on appropriate form, comfortable ROM and mechanics; handout provided ; education on relevant anatomy/physiology and rationale for interventions                                                                                                                         PATIENT EDUCATION:  Education details: rationale for interventions, HEP  Person educated: Patient Education method: Explanation, Demonstration, Tactile cues, Verbal cues Education comprehension: verbalized understanding, returned demonstration, verbal cues required, tactile cues required, and needs further education     HOME  EXERCISE PROGRAM: Access Code: [email or web address] URL: [email or web address] Date: 01/18/2024 Prepared by: Fransisco Hertz  Exercises - Shoulder External Rotation and Scapular Retraction with Resistance  - 2-3 x daily - 1 sets - 8-10 reps - Seated Shoulder Shrugs  - 2-3 x daily - 1 sets - 8-10 reps - Standing 'L' Stretch at Counter  - 2-3 x daily - 1 sets - 8-10 reps  ASSESSMENT:  CLINICAL IMPRESSION: Therapy performed trial of TPDN. She had a great twitch. She had a palpable decrease in tissue tension following the dry needling. We reviewed the use of the thera-cane. We also reviewed gym exercises that she can do. We discussed how to progress gym exercises and  how to grade them properly. We will  continue to progress as tolerated.  Per eval - Patient is a pleasant 58 y.o. woman who was seen today for physical therapy evaluation and treatment for neck pain ongoing over past year - does not endorse any overt limitations but states she does tend to have increased pain at the end of the day, particularly when she is working more (at computer). No red flags endorsed today. On exam she demonstrates concordant limitations in cervical/GH mobility, mild RC weakness, postural deficits, and concordant TTP to L UT, LS, and infraspinatus. Tolerates exam/HEP well with no adverse events or increase in pain. Recommend trial of skilled PT to address aforementioned deficits with aim of improving functional tolerance and reducing pain with typical activities. Pt departs today's session in no acute distress, all voiced concerns/questions addressed appropriately from PT perspective.      OBJECTIVE IMPAIRMENTS: decreased activity tolerance, decreased endurance, decreased mobility, decreased ROM, decreased strength, impaired flexibility, impaired UE functional use, postural dysfunction, and pain.   ACTIVITY LIMITATIONS: sitting and sleeping  PARTICIPATION LIMITATIONS: community activity and occupation  PERSONAL  FACTORS: Time since onset of injury/illness/exacerbation are also affecting patient's functional outcome.   REHAB POTENTIAL: Good  CLINICAL DECISION MAKING: Stable/uncomplicated  EVALUATION COMPLEXITY: Low   GOALS:  SHORT TERM GOALS: Target date: 02/15/2024  Pt will demonstrate appropriate understanding and performance of initially prescribed HEP in order to facilitate improved independence with management of symptoms.  Baseline: HEP established  Goal status: INITIAL   2. Pt will report at least 25% improvement in overall pain levels over past week in order to facilitate improved tolerance to typical daily activities.   Baseline: up to 4/10  Goal status: INITIAL    LONG TERM GOALS: Target date: 03/14/2024  Pt will report at least 50% decrease in overall pain levels in past week in order to facilitate improved tolerance to basic ADLs/mobility.   Baseline: up to 4/10  Goal status: INITIAL    2. Pt will demonstrate at least 60 degrees of active cervical rotation ROM bilaterally in order to demonstrate improved environmental awareness and safety with driving.  Baseline: see ROM chart above Goal status: INITIAL  3. Pt will demonstrate symmetrical and painless shoulder MMT for improved symmetry of UE strength and improved tolerance to functional movements.  Baseline: see MMT chart above Goal status: INITIAL   4. Pt will demonstrate at least 150 deg of L shoulder elevation AROM in order to demonstrate improved tolerance to reaching overhead. Baseline: see ROM chart above  Goal status: INITIAL   5. Pt will demonstrate appropriate performance of final prescribed HEP in order to facilitate improved self-management of symptoms post-discharge.   Baseline: initial HEP prescribed  Goal status: INITIAL    6. Pt will endorse at least 50% improvement in sleep quality/quantity due to neck pain without use of pain medication in order to improve overall health and QOL.   Baseline: able to sleep  w/ assistance of medication  Goal status: INITIAL    PLAN:  PT FREQUENCY: 2x/week  PT DURATION: 8 weeks  PLANNED INTERVENTIONS: 97164- PT Re-evaluation, 97110-Therapeutic exercises, 97530- Therapeutic activity, [id number]- Neuromuscular re-education, 97535- Self Care, 16109- Manual therapy, [id number]- Electrical stimulation (unattended), 60454- Traction (mechanical), Patient/Family education, Balance training, Stair training, Taping, Dry Needling, Joint mobilization, Spinal mobilization, Cryotherapy, and Moist heat  PLAN FOR NEXT SESSION: Review/update HEP PRN. Work on [employer] exercises as appropriate with emphasis on postural endurance, GH/cervical mobility, RC/periscapular strengthening. 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 Symptom modification strategies as indicated/appropriate.    Ashley Murrain PT, DPT 01/31/2024 12:53 PM

## 2024-02-02 ENCOUNTER — Encounter (HOSPITAL_BASED_OUTPATIENT_CLINIC_OR_DEPARTMENT_OTHER): Payer: Self-pay | Admitting: Physical Therapy

## 2024-02-02 ENCOUNTER — Ambulatory Visit (HOSPITAL_BASED_OUTPATIENT_CLINIC_OR_DEPARTMENT_OTHER): Admitting: Physical Therapy

## 2024-02-02 DIAGNOSIS — M542 Cervicalgia: Secondary | ICD-10-CM | POA: Diagnosis present

## 2024-02-02 DIAGNOSIS — R293 Abnormal posture: Secondary | ICD-10-CM | POA: Diagnosis present

## 2024-02-02 DIAGNOSIS — G8929 Other chronic pain: Secondary | ICD-10-CM | POA: Insufficient documentation

## 2024-02-02 DIAGNOSIS — M25512 Pain in left shoulder: Secondary | ICD-10-CM | POA: Diagnosis present

## 2024-02-02 NOTE — Therapy (Signed)
 OUTPATIENT PHYSICAL THERAPY TREATMENT   Patient Name: Gabriela Young MRN: [id number] DOB:August 24, 1966, 58 y.o., female Today's Date: 02/02/2024  END OF SESSION:  PT End of Session - 02/02/24 1047     Visit Number 5    Number of Visits 17    Date for PT Re-Evaluation 03/14/24    Authorization Type aetna    PT Start Time 1015    PT Stop Time 1058    PT Time Calculation (min) 43 min    Activity Tolerance Patient tolerated treatment well;No increased pain    Behavior During Therapy WFL for tasks assessed/performed                Past Medical History:  Diagnosis Date   Irritable bowel syndrome (IBS)    Past Surgical History:  Procedure Laterality Date   ABDOMINAL HYSTERECTOMY     BREAST ENHANCEMENT SURGERY     TONSILLECTOMY     Patient Active Problem List   Diagnosis Date Noted   IBS (irritable bowel syndrome) 02/02/2018   Menopausal syndrome 02/02/2018   Atrophic vaginitis 09/25/2017    PCP: Deloris Ping, MD  REFERRING PROVIDER: Ezzard Standing, Earney Navy, NP  REFERRING DIAG: M54.2 (ICD-10-CM) - Cervicalgia M47.22 (ICD-10-CM) - Other spondylosis with radiculopathy, cervical region  THERAPY DIAG:  Cervicalgia  Chronic left shoulder pain  Abnormal posture  Rationale for Evaluation and Treatment: Rehabilitation  ONSET DATE: over a year  SUBJECTIVE:                                                                                                                                                                                                        4/2 Patient reports it is getting better. She is still having tightness but she feels like it is improving.   Per eval - Pt states she has had pain over past year or more, gestures towards L upper trap. She also notes some lateral shoulder pain and occasional tingling towards L elbow but not distal - she notes that tingling is pretty infrequent. She states she has had episodes with frozen shoulder before that  responded well to PT and she has performed some of those exercises with modest relief at times. She notes most of her pain is at the end of the day, particularly when having done more work activities. She also notes that symptoms have improved some with her transitioning to more part time clinical practice. She states symptoms tend to wax and wane but do not seem to be worsening overall. Denies any BIL symptoms or red flag symptoms.  Hand dominance: Right  SUBJECTIVE STATEMENT: 4/2 Pt states the DN helped out a lot and wants to do it again. She states it still feels a little tight but overall feels that it is getting better.    PERTINENT HISTORY:  IBS  PAIN:  Are you having pain: none, does endorse stiffness/tightness   Per eval -  Location/description: L UT mostly - sometimes has lateral shoulder pain and tingling to elbow  Best-worst over past week: 0-4/10  - aggravating factors: end of day, being at computer a lot, lying on side - Easing factors: mornings, gabapentin, stretching   PRECAUTIONS: None  RED FLAGS: None     WEIGHT BEARING RESTRICTIONS: No  FALLS:  Has patient fallen in last 6 months? 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  DIAGNOSTIC FINDINGS:  No recent imaging in chart, pt corroborates  PATIENT SURVEYS:  NDI 3/50, 6%  COGNITION: Overall cognitive status: Within functional limits for tasks assessed  SENSATION: Does not endorse any sensory issues in clinic today  POSTURE: rounded shoulders  BIL  PALPATION: Concordant TTP w/ trigger points in L infraspinatus, L UT, L LS   CERVICAL ROM:   ROM A/PROM (deg) eval  Flexion 100%  Extension 75% w/ mild deviation towards R  Right lateral flexion <25% *  Left lateral flexion 50%  Right rotation 52 deg *  Left rotation 60 deg   (Blank rows = not tested) (Key: WFL = within functional limits not formally assessed, * = concordant pain, s = stiffness/stretching sensation, NT = not tested) Comment:   UPPER EXTREMITY ROM:  A/PROM Right eval Left eval  Shoulder flexion 159 deg 139 deg *  Shoulder abduction 135 deg 132 deg *  Shoulder internal rotation    Shoulder external rotation    Elbow flexion    Elbow extension    Wrist flexion    Wrist extension     (Blank rows = not tested) (Key: WFL = within functional limits not formally assessed, * = concordant pain, s = stiffness/stretching sensation, NT = not tested)  Comments:    UPPER EXTREMITY MMT:  MMT Right eval Left eval  Shoulder flexion 4 4  Shoulder extension 4 4  Shoulder abduction 4 4  Shoulder extension    Shoulder internal rotation 5 5  Shoulder external rotation 5 4*  Elbow flexion    Elbow extension    Grip strength    (Blank rows = not tested)  (Key: WFL = within functional limits not formally assessed, * = concordant pain, s = stiffness/stretching sensation, NT = not tested)  Comments:   CERVICAL SPECIAL TESTS:  Negative spurling's test    TREATMENT DATE:  4/2  DN Treatment: Pt instructed on Dry Needling rational, procedures, and possible side effects. Pt instructed to expect mild to moderate muscle soreness later in the day and/or into the next day.  Pt instructed in methods to reduce muscle soreness. Pt instructed to continue prescribed HEP. Because Dry Needling was performed over or adjacent to a lung field, pt was educated on S/S of pneumothorax and to seek  immediate medical attention should they occur.  Patient was educated on signs  and symptoms of infection and other risk factors and advised to seek medical attention should they occur.  Patient verbalized understanding of these instructions and education.   Patient Verbal Consent Given: Yes Education Handout Provided: Yes Muscles Treated: left 3x in left upper trap  Electrical Stimulation Performed: No Treatment Response/Outcome: great twitch   Manual: All PROM performed with distraction to reduce pain and improve movement skilled palpation of trigger point Review of self trigger point release   Neuro Re-ed  SA cable rows 2x10 5lbs RPE 5 with postural cueing and shoulder depression cueing Face pulls 2x10 10lbs RPE 5 with shoulder depression cueing   3/28  Trigger Point Dry Needling  Initial Treatment: Pt instructed on Dry Needling rational, procedures, and possible side effects. Pt instructed to expect mild to moderate muscle soreness later in the day and/or into the next day.  Pt instructed in methods to reduce muscle soreness. Pt instructed to continue prescribed HEP. Because Dry Needling was performed over or adjacent to a lung field, pt was educated on S/S of pneumothorax and to seek immediate medical attention should they occur.  Patient was educated on signs and symptoms of infection and other risk factors and advised to seek medical attention should they occur.  Patient verbalized understanding of these instructions and education.   Patient Verbal Consent Given: Yes Education Handout Provided: Yes Muscles Treated: left 3x in left upper trap left cervical paraspinals Electrical Stimulation Performed: No Treatment Response/Outcome: great twitch   Manual: skilled palpation of trigger point Review of self trigger point release  Reviewed of thera-cane and where to purchase  Neuro re-ed  Row machine 3x12 30 lbs  Cable extension 3x12 30 lbs  Reviewed use of RPE to grade exercises all exercises grade at a 4-5 RPE      [facility] Adult PT Treatment:                                                 DATE: 01/20/24 Therapeutic Exercise: Double ER + scap retraction 2x8 seated and at wall for external cue  Shrug x15, red band shrug x10 emphasis on eccentric portion Shoulder flexion walkbacks x10 Supine chin tucks x15 Supine shoulder flexion AAROM x8 HEP update + education/handout   Neuromuscular re-ed: Cervical sidebending isometrics x8 BIL cues for appropriate force output and setup Green band unilateral row 2x8 BIL cues for periscapular activation/retraction Red band high>low unilat row x10 BIL     [facility] Adult PT Treatment:                                                DATE: 01/18/24 Therapeutic Exercise: Double ER + scap retraction, shoulder shrugs, shoulder flexion walkback practice reps + education on appropriate form, comfortable ROM and mechanics; handout provided ; education on relevant anatomy/physiology and rationale for interventions  PATIENT EDUCATION:  Education details: rationale for interventions, HEP  Person educated: Patient Education method: Explanation, Demonstration, Tactile cues, Verbal cues Education comprehension: verbalized understanding, returned demonstration, verbal cues required, tactile cues required, and needs further education     HOME EXERCISE PROGRAM: Access Code: [email or web address] URL: [email or web address] Date: 01/18/2024 Prepared by: Fransisco Hertz  Exercises - Shoulder External Rotation and Scapular Retraction with Resistance  - 2-3 x daily - 1 sets - 8-10 reps - Seated Shoulder Shrugs  - 2-3 x daily - 1 sets - 8-10 reps - Standing 'L' Stretch at Counter  - 2-3 x daily - 1 sets - 8-10 reps  ASSESSMENT:  CLINICAL IMPRESSION: 4/2 Did TPDN today. The patient had  strong twitches in L UT followed by STM. Moved on to neuro re-ed exercises. We focused on posture, shoulder  depression, muscle activation, and TrA activation. Pt tolerated exercises well and really enjoyed the face pulls. Pt will continue to benefit from skilled physical therapy to increase functional strength and endurance.    Per eval - Patient is a pleasant 58 y.o. woman who was seen today for physical therapy evaluation and treatment for neck pain ongoing over past year - does not endorse any overt limitations but states she does tend to have increased pain at the end of the day, particularly when she is working more (at computer). No red flags endorsed today. On exam she demonstrates concordant limitations in cervical/GH mobility, mild RC weakness, postural deficits, and concordant TTP to L UT, LS, and infraspinatus. Tolerates exam/HEP well with no adverse events or increase in pain. Recommend trial of skilled PT to address aforementioned deficits with aim of improving functional tolerance and reducing pain with typical activities. Pt departs today's session in no acute distress, all voiced concerns/questions addressed appropriately from PT perspective.      OBJECTIVE IMPAIRMENTS: decreased activity tolerance, decreased endurance, decreased mobility, decreased ROM, decreased strength, impaired flexibility, impaired UE functional use, postural dysfunction, and pain.   ACTIVITY LIMITATIONS: sitting and sleeping  PARTICIPATION LIMITATIONS: community activity and occupation  PERSONAL FACTORS: Time since onset of injury/illness/exacerbation are also affecting patient's functional outcome.   REHAB POTENTIAL: Good  CLINICAL DECISION MAKING: Stable/uncomplicated  EVALUATION COMPLEXITY: Low   GOALS:  SHORT TERM GOALS: Target date: 02/15/2024  Pt will demonstrate appropriate understanding and performance of initially prescribed HEP in order to facilitate improved independence with management of symptoms.  Baseline: HEP established  Goal status: INITIAL   2. Pt will report at least 25% improvement in  overall pain levels over past week in order to facilitate improved tolerance to typical daily activities.   Baseline: up to 4/10  Goal status: INITIAL    LONG TERM GOALS: Target date: 03/14/2024  Pt will report at least 50% decrease in overall pain levels in past week in order to facilitate improved tolerance to basic ADLs/mobility.   Baseline: up to 4/10  Goal status: INITIAL    2. Pt will demonstrate at least 60 degrees of active cervical rotation ROM bilaterally in order to demonstrate improved environmental awareness and safety with driving.  Baseline: see ROM chart above Goal status: INITIAL  3. Pt will demonstrate symmetrical and painless shoulder MMT for improved symmetry of UE strength and improved tolerance to functional movements.  Baseline: see MMT chart above Goal status: INITIAL   4. Pt will demonstrate at least 150 deg of L shoulder elevation AROM in order to demonstrate improved tolerance to reaching overhead. Baseline: see ROM chart above  Goal status:  INITIAL   5. Pt will demonstrate appropriate performance of final prescribed HEP in order to facilitate improved self-management of symptoms post-discharge.   Baseline: initial HEP prescribed  Goal status: INITIAL    6. Pt will endorse at least 50% improvement in sleep quality/quantity due to neck pain without use of pain medication in order to improve overall health and QOL.   Baseline: able to sleep w/ assistance of medication  Goal status: INITIAL    PLAN:  PT FREQUENCY: 2x/week  PT DURATION: 8 weeks  PLANNED INTERVENTIONS: 97164- PT Re-evaluation, 97110-Therapeutic exercises, 97530- Therapeutic activity, [id number]- Neuromuscular re-education, 97535- Self Care, [id number]- Manual therapy, [id number]- Electrical stimulation (unattended), 96295- Traction (mechanical), Patient/Family education, Balance training, Stair training, Taping, Dry Needling, Joint mobilization, Spinal mobilization, Cryotherapy, and Moist heat  PLAN FOR  NEXT SESSION: Review/update HEP PRN. Work on [employer] exercises as appropriate with emphasis on postural endurance, GH/cervical mobility, RC/periscapular strengthening. 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    Joselyn Glassman Edris Friedt SPT 02/02/2024 1:13 PM

## 2024-02-11 ENCOUNTER — Ambulatory Visit (HOSPITAL_BASED_OUTPATIENT_CLINIC_OR_DEPARTMENT_OTHER): Admitting: Physical Therapy

## 2024-02-11 DIAGNOSIS — R293 Abnormal posture: Secondary | ICD-10-CM

## 2024-02-11 DIAGNOSIS — G8929 Other chronic pain: Secondary | ICD-10-CM

## 2024-02-11 DIAGNOSIS — M542 Cervicalgia: Secondary | ICD-10-CM

## 2024-02-11 NOTE — Therapy (Signed)
 OUTPATIENT PHYSICAL THERAPY TREATMENT   Patient Name: Gabriela Young MRN: [id number] DOB:[birth year], 58 y.o., female Today's Date: 02/11/2024  END OF SESSION:  PT End of Session - 02/11/24 0800     Visit Number 6    Number of Visits 17    Date for PT Re-Evaluation 03/14/24    Authorization Type aetna    PT Start Time 0801    PT Stop Time 0840    PT Time Calculation (min) 39 min    Activity Tolerance Patient tolerated treatment well;No increased pain    Behavior During Therapy WFL for tasks assessed/performed                 Past Medical History:  Diagnosis Date   Irritable bowel syndrome (IBS)    Past Surgical History:  Procedure Laterality Date   ABDOMINAL HYSTERECTOMY     BREAST ENHANCEMENT SURGERY     TONSILLECTOMY     Patient Active Problem List   Diagnosis Date Noted   IBS (irritable bowel syndrome) 02/02/2018   Menopausal syndrome 02/02/2018   Atrophic vaginitis 09/25/2017    PCP: Deloris Ping, MD  REFERRING PROVIDER: Ezzard Standing, Earney Navy, NP  REFERRING DIAG: M54.2 (ICD-10-CM) - Cervicalgia M47.22 (ICD-10-CM) - Other spondylosis with radiculopathy, cervical region  THERAPY DIAG:  No diagnosis found.  Rationale for Evaluation and Treatment: Rehabilitation  ONSET DATE: over a year  SUBJECTIVE:                                                                                                                                                                                                        Per eval - Pt states she has had pain over past year or more, gestures towards L upper trap. She also notes some lateral shoulder pain and occasional tingling towards L elbow but not distal - she notes that tingling is pretty infrequent. She states she has had episodes with frozen shoulder before that responded well to PT and she has performed some of those exercises with modest relief at times. She notes most of her pain is at the end of the day,  particularly when having done more work activities. She also notes that symptoms have improved some with her transitioning to more part time clinical practice. She states symptoms tend to wax and wane but do not seem to be worsening overall. Denies any BIL symptoms or red flag symptoms.  Hand dominance: Right  SUBJECTIVE STATEMENT: Pt states she feels things are getting better. Some pain when she woke up (3/10 pain) but no pain  right now. Pt states she did not have good sleep last night -- was getting texts from family about a new baby in the family. Reports more stiffness now. States TPDN has helped a lot.    PERTINENT HISTORY:  IBS  PAIN:  Are you having pain: none, does endorse stiffness/tightness   Per eval -  Location/description: L UT mostly - sometimes has lateral shoulder pain and tingling to elbow  Best-worst over past week: 0-3/10  - aggravating factors: end of day, being at computer a lot, lying on side - Easing factors: mornings, gabapentin, stretching   PRECAUTIONS: None  RED FLAGS: None     WEIGHT BEARING RESTRICTIONS: No  FALLS:  Has patient fallen in last 6 months? 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  DIAGNOSTIC FINDINGS:  No recent imaging in chart, pt corroborates  PATIENT SURVEYS:  NDI 3/50, 6%  COGNITION: Overall cognitive status: Within functional limits for tasks assessed  SENSATION: Does not endorse any sensory issues in clinic today  POSTURE: rounded shoulders  BIL  PALPATION: Concordant TTP w/ trigger points in L infraspinatus, L UT, L LS   CERVICAL ROM:   ROM A/PROM (deg) eval  Flexion 100%  Extension 75% w/ mild deviation towards R  Right lateral flexion <25% *  Left lateral flexion 50%  Right rotation 52 deg *  Left rotation 60 deg   (Blank rows = not tested) (Key: WFL = within functional limits not formally assessed, * = concordant pain, s = stiffness/stretching sensation, NT = not tested) Comment:   UPPER EXTREMITY ROM:  A/PROM Right eval Left eval  Shoulder flexion 159 deg 139 deg *  Shoulder abduction 135 deg 132 deg *  Shoulder internal rotation    Shoulder external rotation    Elbow flexion    Elbow extension    Wrist flexion    Wrist extension     (Blank rows = not tested) (Key: WFL = within functional limits not formally assessed, * = concordant pain, s = stiffness/stretching sensation, NT = not tested)  Comments:    UPPER EXTREMITY MMT:  MMT Right eval Left eval  Shoulder flexion 4 4  Shoulder extension 4 4  Shoulder abduction 4 4  Shoulder extension    Shoulder internal rotation 5 5  Shoulder external rotation 5 4*  Elbow flexion    Elbow extension    Grip strength    (Blank rows = not tested)  (Key: WFL = within functional limits not formally assessed, * = concordant pain, s = stiffness/stretching sensation, NT = not tested)  Comments:   CERVICAL SPECIAL TESTS:  Negative spurling's test    TREATMENT DATE:  4/11 Therex: UBE L1; 2 min fwd, 2 min bwd Seated UT stretch x 30" R&L Seated levator stretch x30" Seated anterior scalene stretch x 30" Gentle cervical AROM x5 in each direction post needling  Manual: STM & TPR UT, levator scap Trigger Point Dry Needling  Subsequent Treatment: Instructions provided previously at initial dry needling treatment.   Patient Verbal Consent Given: Yes Education Handout Provided: Previously Provided Muscles Treated: L  UT, L lateral cervical  paraspinal Electrical Stimulation Performed: No Treatment Response/Outcome: Twitch response, decreased muscle tension  Gentle grade II to III cervical mobs (side glides, PA, rotations)  Neuro Re-ed: Supine chin tuck 10x3" Supine shoulder ER red TB 2x10 Supine shoulder horizontal abd red TB 2x10 Supine shoulder "W" red TB 2x10   4/2  DN Treatment: Pt instructed on Dry Needling rational, procedures, and possible side effects. Pt instructed to expect mild to moderate muscle soreness later in the day and/or into the next day.  Pt instructed in methods to reduce muscle soreness. Pt instructed to continue prescribed HEP. Because Dry Needling was performed over or adjacent to a lung field, pt was educated on S/S of pneumothorax and to seek immediate medical attention should they occur.  Patient was educated on signs and symptoms of infection and other risk factors and advised to seek medical attention should they occur.  Patient verbalized understanding of these instructions and education.   Patient Verbal Consent Given: Yes Education Handout Provided: Yes Muscles Treated: left 3x in left upper trap  Electrical Stimulation Performed: No Treatment Response/Outcome: great twitch   Manual: All PROM performed with distraction to reduce pain and improve movement skilled palpation of trigger point Review of self trigger point release   Neuro Re-ed  SA cable rows 2x10 5lbs RPE 5 with postural cueing and shoulder depression cueing Face pulls 2x10 10lbs RPE 5 with shoulder depression cueing   3/28  Trigger Point Dry Needling  Initial Treatment: Pt instructed on Dry Needling rational, procedures, and possible side effects. Pt instructed to expect mild to moderate muscle soreness later in the day and/or into the next day.  Pt instructed in methods to reduce muscle soreness. Pt instructed to continue prescribed HEP. Because Dry Needling was performed over or adjacent to a lung field,  pt was educated on S/S of pneumothorax and to seek immediate medical attention should they occur.  Patient was educated on signs and symptoms of infection and other risk factors and advised to seek medical attention should they occur.  Patient verbalized understanding of these instructions and education.   Patient Verbal Consent Given: Yes Education Handout Provided: Yes Muscles Treated: left 3x in left upper trap left cervical paraspinals Electrical Stimulation Performed: No Treatment Response/Outcome: great twitch   Manual: skilled palpation of trigger point Review of self trigger point release  Reviewed of thera-cane and where to purchase  Neuro re-ed  Row machine 3x12 30 lbs  Cable extension 3x12 30 lbs  Reviewed use of RPE to grade exercises all exercises grade at a 4-5 RPE      [facility] Adult PT Treatment:                                                DATE: 01/20/24 Therapeutic Exercise: Double ER + scap retraction 2x8 seated and at wall for external cue  Shrug x15, red band shrug x10 emphasis on eccentric portion Shoulder flexion walkbacks x10 Supine chin tucks x15 Supine shoulder flexion AAROM x8 HEP update + education/handout   Neuromuscular re-ed: Cervical sidebending isometrics x8 BIL cues for appropriate force output and setup Green band unilateral row 2x8 BIL cues for periscapular activation/retraction Red band high>low unilat row x10 BIL     [facility] Adult PT Treatment:  DATE: 01/18/24 Therapeutic Exercise: Double ER + scap retraction, shoulder shrugs, shoulder flexion walkback practice reps + education on appropriate form, comfortable ROM and mechanics; handout provided ; education on relevant anatomy/physiology and rationale for interventions                                                                                                                         PATIENT EDUCATION:  Education details: rationale for  interventions, HEP  Person educated: Patient Education method: Explanation, Demonstration, Tactile cues, Verbal cues Education comprehension: verbalized understanding, returned demonstration, verbal cues required, tactile cues required, and needs further education     HOME EXERCISE PROGRAM: Access Code: [email or web address] URL: [email or web address] Date: 01/18/2024 Prepared by: Fransisco Hertz  Exercises - Shoulder External Rotation and Scapular Retraction with Resistance  - 2-3 x daily - 1 sets - 8-10 reps - Seated Shoulder Shrugs  - 2-3 x daily - 1 sets - 8-10 reps - Standing 'L' Stretch at Counter  - 2-3 x daily - 1 sets - 8-10 reps  ASSESSMENT:  CLINICAL IMPRESSION: Pt continues to have a good response to TPDN. Initiated cervical mobs to improve her hypomobilities (especially in mid cervical vertebrae). Continued postural stabilization exercises. Has full L shoulder PROM but gets impingement symptoms with elevation >100 deg. Post needling, pt is feeling more tightness and restriction in anterior neck with cervical rotation and lateral flexion. Added anterior neck stretching.    Per eval - Patient is a pleasant 58 y.o. woman who was seen today for physical therapy evaluation and treatment for neck pain ongoing over past year - does not endorse any overt limitations but states she does tend to have increased pain at the end of the day, particularly when she is working more (at computer). No red flags endorsed today. On exam she demonstrates concordant limitations in cervical/GH mobility, mild RC weakness, postural deficits, and concordant TTP to L UT, LS, and infraspinatus. Tolerates exam/HEP well with no adverse events or increase in pain. Recommend trial of skilled PT to address aforementioned deficits with aim of improving functional tolerance and reducing pain with typical activities. Pt departs today's session in no acute distress, all voiced concerns/questions addressed appropriately  from PT perspective.      OBJECTIVE IMPAIRMENTS: decreased activity tolerance, decreased endurance, decreased mobility, decreased ROM, decreased strength, impaired flexibility, impaired UE functional use, postural dysfunction, and pain.   ACTIVITY LIMITATIONS: sitting and sleeping  PARTICIPATION LIMITATIONS: community activity and occupation  PERSONAL FACTORS: Time since onset of injury/illness/exacerbation are also affecting patient's functional outcome.   REHAB POTENTIAL: Good  CLINICAL DECISION MAKING: Stable/uncomplicated  EVALUATION COMPLEXITY: Low   GOALS:  SHORT TERM GOALS: Target date: 02/15/2024  Pt will demonstrate appropriate understanding and performance of initially prescribed HEP in order to facilitate improved independence with management of symptoms.  Baseline: HEP established  Goal status: INITIAL   2. Pt will report at least 25% improvement in overall pain levels over past week in order to facilitate improved  tolerance to typical daily activities.   Baseline: up to 4/10  Goal status: INITIAL    LONG TERM GOALS: Target date: 03/14/2024  Pt will report at least 50% decrease in overall pain levels in past week in order to facilitate improved tolerance to basic ADLs/mobility.   Baseline: up to 4/10  Goal status: INITIAL    2. Pt will demonstrate at least 60 degrees of active cervical rotation ROM bilaterally in order to demonstrate improved environmental awareness and safety with driving.  Baseline: see ROM chart above Goal status: INITIAL  3. Pt will demonstrate symmetrical and painless shoulder MMT for improved symmetry of UE strength and improved tolerance to functional movements.  Baseline: see MMT chart above Goal status: INITIAL   4. Pt will demonstrate at least 150 deg of L shoulder elevation AROM in order to demonstrate improved tolerance to reaching overhead. Baseline: see ROM chart above  Goal status: INITIAL   5. Pt will demonstrate appropriate  performance of final prescribed HEP in order to facilitate improved self-management of symptoms post-discharge.   Baseline: initial HEP prescribed  Goal status: INITIAL    6. Pt will endorse at least 50% improvement in sleep quality/quantity due to neck pain without use of pain medication in order to improve overall health and QOL.   Baseline: able to sleep w/ assistance of medication  Goal status: INITIAL    PLAN:  PT FREQUENCY: 2x/week  PT DURATION: 8 weeks  PLANNED INTERVENTIONS: 97164- PT Re-evaluation, 97110-Therapeutic exercises, 97530- Therapeutic activity, [id number]- Neuromuscular re-education, 97535- Self Care, 09811- Manual therapy, [id number]- Electrical stimulation (unattended), 91478- Traction (mechanical), Patient/Family education, Balance training, Stair training, Taping, Dry Needling, Joint mobilization, Spinal mobilization, Cryotherapy, and Moist heat  PLAN FOR NEXT SESSION: Review/update HEP PRN. Work on [employer] exercises as appropriate with emphasis on postural endurance, GH/cervical mobility, RC/periscapular strengthening. 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    Lillieann Pavlich April Ma L Almarie Kurdziel, PT, DPT 02/11/2024 8:01 AM

## 2024-06-11 ENCOUNTER — Ambulatory Visit
Admission: RE | Admit: 2024-06-11 | Discharge: 2024-06-11 | Disposition: A | Discharge status: Home / Self Care | Payer: Self-pay | Source: Ambulatory Visit

## 2024-06-11 ENCOUNTER — Other Ambulatory Visit: Payer: Self-pay

## 2024-06-11 VITALS — BP 110/75 | HR 84 | Temp 98.4°F | Resp 16

## 2024-06-11 DIAGNOSIS — R0981 Nasal congestion: Secondary | ICD-10-CM

## 2024-06-11 DIAGNOSIS — J0100 Acute maxillary sinusitis, unspecified: Secondary | ICD-10-CM

## 2024-06-11 DIAGNOSIS — J309 Allergic rhinitis, unspecified: Secondary | ICD-10-CM | POA: Diagnosis not present

## 2024-06-11 MED ORDER — FEXOFENADINE HCL 180 MG PO TABS: 180.0000 mg | ORAL_TABLET | Freq: Every day | ORAL | 0 refills | Status: AC

## 2024-06-11 MED ORDER — AMOXICILLIN-POT CLAVULANATE 875-125 MG PO TABS: 1.0000 | ORAL_TABLET | Freq: Two times a day (BID) | ORAL | 0 refills | Status: AC

## 2024-06-11 MED ORDER — PREDNISONE 50 MG PO TABS: ORAL_TABLET | ORAL | 0 refills | Status: AC

## 2024-06-11 NOTE — Discharge Instructions (Addendum)
 Advised patient to take medication as directed with food to completion.  Advised patient to take prednisone  and Allegra  with first dose of Augmentin  for the next 5 of 7 days.  Advised may use Allegra  as needed for concurrent postnasal drainage/drip/runny nose/sneezing.  Encouraged to increase daily water intake to 64 ounces per day while taking these medications.  Advised if symptoms worsen and/or unresolved please follow-up with your PCP or here for further evaluation.

## 2024-06-11 NOTE — ED Triage Notes (Signed)
 Patient presents to Urgent Care with complaints of cough, sinus pain, earache, nasal congestion since 1 week ago. Patient reports husband had a cough. Denies any fever or chills. Taking Delsym, Ibuprofen and Mucinex for symptoms.

## 2024-06-11 NOTE — ED Provider Notes (Signed)
 [facility]    CSN: [id number] Arrival date & time: 06/11/24  9182      History   Chief Complaint Chief Complaint  Patient presents with   Cough    Entered by patient   Nasal Congestion   Otalgia    HPI Gabriela Young is a 58 y.o. female.   HPI Very pleasant 58 year old female presents with cough, sinus pain, earache, nasal congestion for 1 week.  Patient reports that husband had cough.  Patient reports taking Delsym, ibuprofen and Mucinex for symptoms.  PMH significant for IBS.  Past Medical History:  Diagnosis Date   Irritable bowel syndrome (IBS)     Patient Active Problem List   Diagnosis Date Noted   IBS (irritable bowel syndrome) 02/02/2018   Menopausal syndrome 02/02/2018   Atrophic vaginitis 09/25/2017    Past Surgical History:  Procedure Laterality Date   ABDOMINAL HYSTERECTOMY     BREAST ENHANCEMENT SURGERY     TONSILLECTOMY      OB History   No obstetric history on file.      Home Medications    Prior to Admission medications   Medication Sig Start Date End Date Taking? Authorizing Provider  amoxicillin -clavulanate (AUGMENTIN ) 875-125 MG tablet Take 1 tablet by mouth every 12 (twelve) hours. 06/11/24  Yes Teddy Sharper, FNP  CycloSPORINE (RESTASIS OP) Apply to eye.   Yes [provider]  estradiol (VIVELLE-DOT) 0.1 MG/24HR patch APP 1 PA EXT TO THE SKIN 2 TIMES A WK 11/15/17  Yes [provider]  fexofenadine  (ALLEGRA  ALLERGY) 180 MG tablet Take 1 tablet (180 mg total) by mouth daily for 15 days. 06/11/24 06/26/24 Yes Teddy Sharper, FNP  predniSONE  (DELTASONE ) 50 MG tablet Take 1 tab p.o. daily for 5 days. 06/11/24  Yes Teddy Sharper, FNP  celecoxib (CELEBREX) 100 MG capsule TAKE 1 CAPSULE(100 MG) BY MOUTH TWICE DAILY 11/12/22   [provider]  cyclobenzaprine  (FLEXERIL ) 10 MG tablet Take 1 tablet (10 mg total) by mouth 2 (two) times daily as needed for muscle spasms. 02/10/21   Waylan Elsie PARAS, PA-C   estradiol (ESTRACE) 0.1 MG/GM vaginal cream estradiol 0.01% (0.1 mg/gram) vaginal cream  PLACE 1 GRAM VAGINALLY 2 TIMES A WK HS    [provider]  VESTA 290 MCG CAPS capsule  05/10/19   [provider]    Family History History reviewed. No pertinent family history.  Social History Social History   Tobacco Use   Smoking status: Never   Smokeless tobacco: Never  Substance Use Topics   Alcohol use: Yes   Drug use: Never     Allergies   Sulfa antibiotics   Review of Systems Review of Systems  HENT:  Positive for congestion, rhinorrhea and sinus pain.   Respiratory:  Positive for cough.   All other systems reviewed and are negative.    Physical Exam Triage Vital Signs ED Triage Vitals  Encounter Vitals Group     BP 06/11/24 0840 110/75     Girls Systolic BP Percentile --      Girls Diastolic BP Percentile --      Boys Systolic BP Percentile --      Boys Diastolic BP Percentile --      Pulse Rate 06/11/24 0840 84     Resp 06/11/24 0840 16     Temp 06/11/24 0840 98.4 F (36.9 C)     Temp Source 06/11/24 0840 Oral     SpO2 06/11/24 0840 97 %  Weight --      Height --      Head Circumference --      Peak Flow --      Pain Score 06/11/24 0838 3     Pain Loc --      Pain Education --      Exclude from Growth Chart --    No data found.  Updated Vital Signs BP 110/75 (BP Location: Right Arm)   Pulse 84   Temp 98.4 F (36.9 C) (Oral)   Resp 16   SpO2 97%    Physical Exam Vitals and nursing note reviewed.  Constitutional:      Appearance: Normal appearance. She is normal weight. She is ill-appearing.  HENT:     Head: Normocephalic and atraumatic.     Right Ear: Tympanic membrane and external ear normal.     Left Ear: Tympanic membrane and external ear normal.     Ears:     Comments: Significant eustachian tube dysfunction noted bilaterally    Nose: Nose normal.     Mouth/Throat:     Mouth: Mucous membranes are moist.      Pharynx: Oropharynx is clear.     Comments: Significant amount of clear drainage of posterior oropharynx noted Eyes:     Extraocular Movements: Extraocular movements intact.     Pupils: Pupils are equal, round, and reactive to light.  Cardiovascular:     Rate and Rhythm: Normal rate and regular rhythm.     Pulses: Normal pulses.     Heart sounds: Normal heart sounds.  Pulmonary:     Effort: Pulmonary effort is normal.     Breath sounds: Normal breath sounds. No wheezing, rhonchi or rales.  Musculoskeletal:        General: Normal range of motion.  Skin:    General: Skin is warm and dry.  Neurological:     General: No focal deficit present.     Mental Status: She is alert and oriented to person, place, and time. Mental status is at baseline.  Psychiatric:        Mood and Affect: Mood normal.        Behavior: Behavior normal.      UC Treatments / Results  Labs (all labs ordered are listed, but only abnormal results are displayed) Labs Reviewed - No data to display  EKG   Radiology No results found.  Procedures Procedures (including critical care time)  Medications Ordered in UC Medications - No data to display  Initial Impression / Assessment and Plan / UC Course  I have reviewed the triage vital signs and the nursing notes.  Pertinent labs & imaging results that were available during my care of the patient were reviewed by me and considered in my medical decision making (see chart for details).     MDM: 1.  Acute maxillary sinusitis, recurrence not specified-Rx Augmentin  875/125 mg tablet: Take 1 tablet twice daily x 7 days; 2.  Nasal sinus congestion-Rx prednisone  50 mg tablet: Take 1 tablet daily x 5 days; 3.  Allergic rhinitis, unspecified seasonality, unspecified trigger-Rx'd Allegra  180 mg tablet: Take 1 tablet daily x 5 days. Advised patient to take medication as directed with food to completion.  Advised patient to take prednisone  and Allegra  with first dose of  Augmentin  for the next 5 of 7 days.  Advised may use Allegra  as needed for concurrent postnasal drainage/drip/runny nose/sneezing.  Encouraged to increase daily water intake to 64 ounces per day while taking  these medications.  Advised if symptoms worsen and/or unresolved please follow-up with your PCP or here for further evaluation.  Patient discharged home, hemodynamically stable. Final Clinical Impressions(s) / UC Diagnoses   Final diagnoses:  Acute maxillary sinusitis, recurrence not specified  Nasal sinus congestion  Allergic rhinitis, unspecified seasonality, unspecified trigger     Discharge Instructions      Advised patient to take medication as directed with food to completion.  Advised patient to take prednisone  and Allegra  with first dose of Augmentin  for the next 5 of 7 days.  Advised may use Allegra  as needed for concurrent postnasal drainage/drip/runny nose/sneezing.  Encouraged to increase daily water intake to 64 ounces per day while taking these medications.  Advised if symptoms worsen and/or unresolved please follow-up with your PCP or here for further evaluation.     ED Prescriptions     Medication Sig Dispense Auth. Provider   amoxicillin -clavulanate (AUGMENTIN ) 875-125 MG tablet Take 1 tablet by mouth every 12 (twelve) hours. 14 tablet Shenetta Schnackenberg, FNP   predniSONE  (DELTASONE ) 50 MG tablet Take 1 tab p.o. daily for 5 days. 5 tablet Kyrie Bun, FNP   fexofenadine  (ALLEGRA  ALLERGY) 180 MG tablet Take 1 tablet (180 mg total) by mouth daily for 15 days. 15 tablet Kylen Schliep, FNP      PDMP not reviewed this encounter.   Teddy Sharper, FNP 06/11/24 [phone]
